# Patient Record
Sex: Female | Born: 1990
Health system: Southern US, Community
[De-identification: ages and names within clinical notes are randomized; demographics above are authoritative.]

## PROBLEM LIST (undated history)

## (undated) ENCOUNTER — Inpatient Hospital Stay (HOSPITAL_COMMUNITY): Payer: Self-pay

## (undated) DIAGNOSIS — D649 Anemia, unspecified: Secondary | ICD-10-CM

## (undated) DIAGNOSIS — R51 Headache: Secondary | ICD-10-CM

## (undated) HISTORY — PX: NO PAST SURGERIES: SHX2092

---
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
## 2012-04-03 NOTE — L&D Delivery Note (Signed)
After reviewing with the patient the risks of a vacuum delivery, verbal informed consent was obtained.  The vacuum was placed at +2 station.  There were no pop-offs and no complications.  Hassen Bruun L. Harraway-Smith, M.D., [facility] Core

## 2012-04-03 NOTE — L&D Delivery Note (Signed)
Operative Delivery Note Pushed for a half hour, with little descent, so labored down for an hour.  Then began pushing again and pushed for 2 hours. At that time, there was molding but no caput. Dr Burnice Logan  Katrinka Blazing was consulted.  She did intensive pushing and the vertex was brought low enough to attempt a vacuum assisted delivery.  At  a viable and healthy female was delivered via .  Presentation: vertex; Position: Left,, Occiput,, Anterior; Station: +1.  Verbal consent: obtained from patient.  Risks and benefits discussed in detail.  Risks include, but are not limited to the risks of anesthesia, bleeding, infection, damage to maternal tissues, fetal cephalhematoma.  There is also the risk of inability to effect vaginal delivery of the head, or shoulder dystocia that cannot be resolved by established maneuvers, leading to the need for emergency cesarean section.  Kiwi vacuum was applied to the vertex. There were 3 pulls with no pop-offs No difficulty with shoulders.  APGAR: 8/9 ; weight .   Placenta status: spontaneous and grossly intact with 3VC .   Cord:  with the following complications: nuchal cord x 1, clamped and cut prior to delivery  Anesthesia: Epidural Instruments: KIWI vacuum Episiotomy: none Lacerations: periurethral, right vaginal sulcus, and 2nd degree perineal Suture Repair: 3.0 vicryl Est. Blood Loss (mL): 400  Mom to postpartum.  Baby to with mother.  [facility] 01/15/2013, 5:12 AM

## 2012-07-31 ENCOUNTER — Encounter (HOSPITAL_COMMUNITY): Payer: Self-pay

## 2012-07-31 ENCOUNTER — Inpatient Hospital Stay (HOSPITAL_COMMUNITY)
Admission: AD | Admit: 2012-07-31 | Discharge: 2012-07-31 | Disposition: A | Discharge status: Home / Self Care | Payer: Medicaid Other | Source: Ambulatory Visit | Attending: Obstetrics & Gynecology | Admitting: Obstetrics & Gynecology

## 2012-07-31 DIAGNOSIS — Z3490 Encounter for supervision of normal pregnancy, unspecified, unspecified trimester: Secondary | ICD-10-CM

## 2012-07-31 DIAGNOSIS — R109 Unspecified abdominal pain: Secondary | ICD-10-CM | POA: Insufficient documentation

## 2012-07-31 DIAGNOSIS — O99891 Other specified diseases and conditions complicating pregnancy: Secondary | ICD-10-CM | POA: Insufficient documentation

## 2012-07-31 DIAGNOSIS — N949 Unspecified condition associated with female genital organs and menstrual cycle: Secondary | ICD-10-CM

## 2012-07-31 HISTORY — DX: Headache: R51

## 2012-07-31 LAB — URINE MICROSCOPIC-ADD ON

## 2012-07-31 LAB — URINALYSIS, ROUTINE W REFLEX MICROSCOPIC
Bilirubin Urine: NEGATIVE
Glucose, UA: NEGATIVE mg/dL
Nitrite: NEGATIVE
Protein, ur: NEGATIVE mg/dL
Urobilinogen, UA: 0.2 mg/dL (ref 0.0–1.0)
pH: 7 (ref 5.0–8.0)

## 2012-07-31 LAB — WET PREP, GENITAL: Yeast Wet Prep HPF POC: NONE SEEN

## 2012-07-31 NOTE — MAU Note (Signed)
Pt states here for no pnc, 1st pregnancy, is worried r/t no care thus far. Denies bleeding or vag d/c changes. Plans to possibly go to [facility] for [facility]. Here for lower abd pain bilaterally when getting up, turning, etc. Pain is intermittent.

## 2012-07-31 NOTE — MAU Provider Note (Signed)
History     CSN: [id number]  Arrival date and time: 07/31/12 1025   First Provider Initiated Contact with Patient 07/31/12 1343      Chief Complaint  Patient presents with  . Abdominal Pain   HPI  Natalie Marks is 22 y.o. G1P0 [redacted]w[redacted]d weeks presenting with abdominal pain-describes as sharps, intermittent pain with quick movements.  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  Denies vaginal bleeding or discharge. Denies nausea, vomiting.   Plans  OB care at [facility].     Past Medical History  Diagnosis Date  . Headache     Past Surgical History  Procedure Laterality Date  . No past surgeries      History reviewed. No pertinent family history.  History  Substance Use Topics  . Smoking status: Never Smoker   . Smokeless tobacco: Never Used  . Alcohol Use: No    Allergies: No Known Allergies  Prescriptions prior to admission  Medication Sig Dispense Refill  . acetaminophen (TYLENOL) 325 MG tablet Take 650 mg by mouth every 6 (six) hours as needed for pain.      . Prenatal Vit-Fe Fumarate-FA (PRENATAL MULTIVITAMIN) TABS Take 1 tablet by mouth daily at 12 noon.        Review of Systems  Constitutional: Negative.   HENT: Negative.   Gastrointestinal: Positive for abdominal pain (sharp, intermittent on the sides, bilaterally). Negative for nausea, vomiting, diarrhea and constipation.  Genitourinary:       Neg for vaginal bleeding and discharge   Physical Exam   Blood pressure 100/56, pulse 86, temperature 98.3 F (36.8 C), temperature source Oral, resp. rate 16, height 5' (1.524 m), weight 108 lb 6.4 oz (49.17 kg), last menstrual period 04/05/2012, SpO2 99.00%.  Physical Exam  Constitutional: She is oriented to person, place, and time. She appears well-developed and well-nourished. No distress.  HENT:  Head: Normocephalic.  Neck: Normal range of motion.  Cardiovascular: Normal rate.   Respiratory: Effort normal.  GI: Soft. She exhibits no distension and no mass.  There is no tenderness. There is no rebound and no guarding.  Patient points to lower sides bilaterally   Genitourinary: There is no rash, tenderness or lesion on the right labia. There is no rash, tenderness or lesion on the left labia. Uterus is enlarged (measures 17 weeks size). Uterus is not tender. No erythema or bleeding around the vagina. Vaginal discharge (small amount of yellowish discharge without odor) found.  Neurological: She is alert and oriented to person, place, and time.  Skin: Skin is warm and dry.  Psychiatric: She has a normal mood and affect. Her behavior is normal.   FETAL HEART RATE  150s  Results for orders placed during the hospital encounter of 07/31/12 (from the past 24 hour(s))  URINALYSIS, ROUTINE W REFLEX MICROSCOPIC     Status: Abnormal   Collection Time    07/31/12 10:49 AM      Result Value Range   Color, Urine YELLOW  YELLOW   APPearance CLEAR  CLEAR   Specific Gravity, Urine 1.015  1.005 - 1.030   pH 7.0  5.0 - 8.0   Glucose, UA NEGATIVE  NEGATIVE mg/dL   Hgb urine dipstick NEGATIVE  NEGATIVE   Bilirubin Urine NEGATIVE  NEGATIVE   Ketones, ur NEGATIVE  NEGATIVE mg/dL   Protein, ur NEGATIVE  NEGATIVE mg/dL   Urobilinogen, UA 0.2  0.0 - 1.0 mg/dL   Nitrite NEGATIVE  NEGATIVE   Leukocytes, UA LARGE (*)  NEGATIVE  URINE MICROSCOPIC-ADD ON     Status: Abnormal   Collection Time    07/31/12 10:49 AM      Result Value Range   Squamous Epithelial / LPF FEW (*) RARE   WBC, UA 7-10  <3 WBC/hpf   Bacteria, UA FEW (*) RARE   Urine-Other MUCOUS PRESENT    WET PREP, GENITAL     Status: Abnormal   Collection Time    07/31/12  2:00 PM      Result Value Range   Yeast Wet Prep HPF POC NONE SEEN  NONE SEEN   Trich, Wet Prep NONE SEEN  NONE SEEN   Clue Cells Wet Prep HPF POC FEW (*) NONE SEEN   WBC, Wet Prep HPF POC MANY (*) NONE SEEN   MAU Course  Procedures  MDM  Assessment and Plan  A:  Viable IUP at [redacted]w[redacted]d gestation       Abdominal pain consistent  with Round ligament pain  P:  Begin prenatal care with [facility].      Reassured, instructed to move a little more slowly to prevent discomfort.   Laketra Bowdish,EVE M 07/31/2012, 1:44 PM

## 2012-07-31 NOTE — MAU Note (Signed)
Patient state she has had no prenatal care. Has been having sharp right side pain on and off with movement for 2-3 weeks. Denies bleeding, leaking, nausea, vomiting or diarrhea.

## 2012-08-01 LAB — URINE CULTURE

## 2012-08-02 ENCOUNTER — Encounter: Payer: Self-pay | Admitting: Obstetrics & Gynecology

## 2012-08-02 ENCOUNTER — Ambulatory Visit (INDEPENDENT_AMBULATORY_CARE_PROVIDER_SITE_OTHER): Payer: Self-pay | Admitting: Obstetrics & Gynecology

## 2012-08-02 VITALS — BP 91/54 | Wt 108.0 lb

## 2012-08-02 DIAGNOSIS — Z3402 Encounter for supervision of normal first pregnancy, second trimester: Secondary | ICD-10-CM

## 2012-08-02 DIAGNOSIS — Z3400 Encounter for supervision of normal first pregnancy, unspecified trimester: Secondary | ICD-10-CM

## 2012-08-02 NOTE — Progress Notes (Signed)
   Subjective:    Natalie Marks is a G1P0 [redacted]w[redacted]d being seen today for her first obstetrical visit.  Her obstetrical history is significant for teenager. Patient does intend to breast feed. Pregnancy history fully reviewed.  Patient reports no complaints.  There were no vitals filed for this visit.  HISTORY: OB History   Grav Para Term Preterm Abortions TAB SAB Ect Mult Living   1              # Outc Date GA Lbr Len/2nd Wgt Sex Del Anes PTL Lv   1 CUR              Past Medical History  Diagnosis Date  . Headache    Past Surgical History  Procedure Laterality Date  . No past surgeries     No family history on file.   Exam    Uterus:     Pelvic Exam:    Perineum: No Hemorrhoids   Vulva: normal   Vagina:  normal mucosa   pH:    Cervix: anteverted   Adnexa: normal adnexa   Bony Pelvis: android  System: Breast:  normal appearance, no masses or tenderness   Skin: normal coloration and turgor, no rashes    Neurologic: oriented   Extremities: normal strength, tone, and muscle mass   HEENT PERRLA   Mouth/Teeth mucous membranes moist, pharynx normal without lesions   Neck supple   Cardiovascular: regular rate and rhythm   Respiratory:  appears well, vitals normal, no respiratory distress, acyanotic, normal RR, ear and throat exam is normal, neck free of mass or lymphadenopathy, chest clear, no wheezing, crepitations, rhonchi, normal symmetric air entry   Abdomen: soft, non-tender; bowel sounds normal; no masses,  no organomegaly   Urinary: urethral meatus normal      Assessment:    Pregnancy: G1P0 There are no active problems to display for this patient.       Plan:     Initial labs drawn. Prenatal vitamins. Problem list reviewed and updated. Genetic Screening discussed Quad Screen: requested.  Ultrasound discussed; fetal survey: requested.  Follow up in 4 weeks.    Natalie Marks C. 08/02/2012

## 2012-08-02 NOTE — Progress Notes (Signed)
P - 81 

## 2012-08-03 LAB — HIV ANTIBODY (ROUTINE TESTING W REFLEX): HIV: NONREACTIVE

## 2012-08-04 LAB — CULTURE, OB URINE
Colony Count: NO GROWTH
Organism ID, Bacteria: NO GROWTH

## 2012-08-05 NOTE — MAU Provider Note (Signed)
Attestation of Attending Supervision of Advanced Practitioner (CNM/NP): Evaluation and management procedures were performed by the Advanced Practitioner under my supervision and collaboration. I have reviewed the Advanced Practitioner's note and chart, and I agree with the management and plan.  Siani Utke H. 3:35 PM

## 2012-08-06 LAB — ALPHA FETOPROTEIN, MATERNAL
AFP: 33.6 IU/mL
Curr Gest Age: 17 wks.days
MoM for AFP: 0.83

## 2012-08-06 LAB — OBSTETRIC PANEL
Antibody Screen: NEGATIVE
Eosinophils Absolute: 0.1 10*3/uL (ref 0.0–0.7)
MCHC: 34 g/dL (ref 30.0–36.0)
Monocytes Relative: 6 % (ref 3–12)
Rubella: 1.71 Index — ABNORMAL HIGH (ref <0.90)

## 2012-08-12 ENCOUNTER — Ambulatory Visit (HOSPITAL_COMMUNITY)
Admission: RE | Admit: 2012-08-12 | Discharge: 2012-08-12 | Disposition: A | Discharge status: Home / Self Care | Payer: Medicaid Other | Source: Ambulatory Visit | Attending: Obstetrics & Gynecology | Admitting: Obstetrics & Gynecology

## 2012-08-12 DIAGNOSIS — Z363 Encounter for antenatal screening for malformations: Secondary | ICD-10-CM | POA: Insufficient documentation

## 2012-08-12 DIAGNOSIS — Z3402 Encounter for supervision of normal first pregnancy, second trimester: Secondary | ICD-10-CM

## 2012-08-12 DIAGNOSIS — O358XX0 Maternal care for other (suspected) fetal abnormality and damage, not applicable or unspecified: Secondary | ICD-10-CM | POA: Insufficient documentation

## 2012-08-12 DIAGNOSIS — Z1389 Encounter for screening for other disorder: Secondary | ICD-10-CM | POA: Insufficient documentation

## 2012-08-30 ENCOUNTER — Encounter: Payer: Self-pay | Admitting: Obstetrics & Gynecology

## 2012-09-03 ENCOUNTER — Encounter: Payer: Self-pay | Admitting: Family Medicine

## 2012-09-03 ENCOUNTER — Ambulatory Visit (INDEPENDENT_AMBULATORY_CARE_PROVIDER_SITE_OTHER): Payer: Medicaid Other | Admitting: Family Medicine

## 2012-09-03 VITALS — BP 99/60 | Wt 119.0 lb

## 2012-09-03 DIAGNOSIS — Z3402 Encounter for supervision of normal first pregnancy, second trimester: Secondary | ICD-10-CM

## 2012-09-03 DIAGNOSIS — Z3400 Encounter for supervision of normal first pregnancy, unspecified trimester: Secondary | ICD-10-CM | POA: Insufficient documentation

## 2012-09-03 NOTE — Patient Instructions (Signed)
Pregnancy - Second Trimester The second trimester of pregnancy (3 to 6 months) is a period of rapid growth for you and your baby. At the end of the sixth month, your baby is about 9 inches long and weighs 1 1/2 pounds. You will begin to feel the baby move between 18 and 20 weeks of the pregnancy. This is called quickening. Weight gain is faster. A clear fluid (colostrum) may leak out of your breasts. You may feel small contractions of the womb (uterus). This is known as false labor or Braxton-Hicks contractions. This is like a practice for labor when the baby is ready to be born. Usually, the problems with morning sickness have usually passed by the end of your first trimester. Some women develop small dark blotches (called cholasma, mask of pregnancy) on their face that usually goes away after the baby is born. Exposure to the sun makes the blotches worse. Acne may also develop in some pregnant women and pregnant women who have acne, may find that it goes away. PRENATAL EXAMS  Blood work may continue to be done during prenatal exams. These tests are done to check on your health and the probable health of your baby. Blood work is used to follow your blood levels (hemoglobin). Anemia (low hemoglobin) is common during pregnancy. Iron and vitamins are given to help prevent this. You will also be checked for diabetes between 24 and 28 weeks of the pregnancy. Some of the previous blood tests may be repeated.  The size of the uterus is measured during each visit. This is to make sure that the baby is continuing to grow properly according to the dates of the pregnancy.  Your blood pressure is checked every prenatal visit. This is to make sure you are not getting toxemia.  Your urine is checked to make sure you do not have an infection, diabetes or protein in the urine.  Your weight is checked often to make sure gains are happening at the suggested rate. This is to ensure that both you and your baby are  growing normally.  Sometimes, an ultrasound is performed to confirm the proper growth and development of the baby. This is a test which bounces harmless sound waves off the baby so your caregiver can more accurately determine due dates. Sometimes, a test is done on the amniotic fluid surrounding the baby. This test is called an amniocentesis. The amniotic fluid is obtained by sticking a needle into the belly (abdomen). This is done to check the chromosomes in instances where there is a concern about possible genetic problems with the baby. It is also sometimes done near the end of pregnancy if an early delivery is required. In this case, it is done to help make sure the baby's lungs are mature enough for the baby to live outside of the womb. CHANGES OCCURING IN THE SECOND TRIMESTER OF PREGNANCY Your body goes through many changes during pregnancy. They vary from person to person. Talk to your caregiver about changes you notice that you are concerned about.  During the second trimester, you will likely have an increase in your appetite. It is normal to have cravings for certain foods. This varies from person to person and pregnancy to pregnancy.  Your lower abdomen will begin to bulge.  You may have to urinate more often because the uterus and baby are pressing on your bladder. It is also common to get more bladder infections during pregnancy. You can help this by drinking lots of fluids   and emptying your bladder before and after intercourse.  You may begin to get stretch marks on your hips, abdomen, and breasts. These are normal changes in the body during pregnancy. There are no exercises or medicines to take that prevent this change.  You may begin to develop swollen and bulging veins (varicose veins) in your legs. Wearing support hose, elevating your feet for 15 minutes, 3 to 4 times a day and limiting salt in your diet helps lessen the problem.  Heartburn may develop as the uterus grows and  pushes up against the stomach. Antacids recommended by your caregiver helps with this problem. Also, eating smaller meals 4 to 5 times a day helps.  Constipation can be treated with a stool softener or adding bulk to your diet. Drinking lots of fluids, and eating vegetables, fruits, and whole grains are helpful.  Exercising is also helpful. If you have been very active up until your pregnancy, most of these activities can be continued during your pregnancy. If you have been less active, it is helpful to start an exercise program such as walking.  Hemorrhoids may develop at the end of the second trimester. Warm sitz baths and hemorrhoid cream recommended by your caregiver helps hemorrhoid problems.  Backaches may develop during this time of your pregnancy. Avoid heavy lifting, wear low heal shoes, and practice good posture to help with backache problems.  Some pregnant women develop tingling and numbness of their hand and fingers because of swelling and tightening of ligaments in the wrist (carpel tunnel syndrome). This goes away after the baby is born.  As your breasts enlarge, you may have to get a bigger bra. Get a comfortable, cotton, support bra. Do not get a nursing bra until the last month of the pregnancy if you will be nursing the baby.  You may get a dark line from your belly button to the pubic area called the linea nigra.  You may develop rosy cheeks because of increase blood flow to the face.  You may develop spider looking lines of the face, neck, arms, and chest. These go away after the baby is born. HOME CARE INSTRUCTIONS   It is extremely important to avoid all smoking, herbs, alcohol, and unprescribed drugs during your pregnancy. These chemicals affect the formation and growth of the baby. Avoid these chemicals throughout the pregnancy to ensure the delivery of a healthy infant.  Most of your home care instructions are the same as suggested for the first trimester of your  pregnancy. Keep your caregiver's appointments. Follow your caregiver's instructions regarding medicine use, exercise, and diet.  During pregnancy, you are providing food for you and your baby. Continue to eat regular, well-balanced meals. Choose foods such as meat, fish, milk and other low fat dairy products, vegetables, fruits, and whole-grain breads and cereals. Your caregiver will tell you of the ideal weight gain.  A physical sexual relationship may be continued up until near the end of pregnancy if there are no other problems. Problems could include early (premature) leaking of amniotic fluid from the membranes, vaginal bleeding, abdominal pain, or other medical or pregnancy problems.  Exercise regularly if there are no restrictions. Check with your caregiver if you are unsure of the safety of some of your exercises. The greatest weight gain will occur in the last 2 trimesters of pregnancy. Exercise will help you:  Control your weight.  Get you in shape for labor and delivery.  Lose weight after you have the baby.  Wear   a good support or jogging bra for breast tenderness during pregnancy. This may help if worn during sleep. Pads or tissues may be used in the bra if you are leaking colostrum.  Do not use hot tubs, steam rooms or saunas throughout the pregnancy.  Wear your seat belt at all times when driving. This protects you and your baby if you are in an accident.  Avoid raw meat, uncooked cheese, cat litter boxes, and soil used by cats. These carry germs that can cause birth defects in the baby.  The second trimester is also a good time to visit your dentist for your dental health if this has not been done yet. Getting your teeth cleaned is okay. Use a soft toothbrush. Brush gently during pregnancy.  It is easier to leak urine during pregnancy. Tightening up and strengthening the pelvic muscles will help with this problem. Practice stopping your urination while you are going to the  bathroom. These are the same muscles you need to strengthen. It is also the muscles you would use as if you were trying to stop from passing gas. You can practice tightening these muscles up 10 times a set and repeating this about 3 times per day. Once you know what muscles to tighten up, do not perform these exercises during urination. It is more likely to contribute to an infection by backing up the urine.  Ask for help if you have financial, counseling, or nutritional needs during pregnancy. Your caregiver will be able to offer counseling for these needs as well as refer you for other special needs.  Your skin may become oily. If so, wash your face with mild soap, use non-greasy moisturizer and oil or cream based makeup. MEDICINES AND DRUG USE IN PREGNANCY  Take prenatal vitamins as directed. The vitamin should contain 1 milligram of folic acid. Keep all vitamins out of reach of children. Only a couple vitamins or tablets containing iron may be fatal to a baby or young child when ingested.  Avoid use of all medicines, including herbs, over-the-counter medicines, not prescribed or suggested by your caregiver. Only take over-the-counter or prescription medicines for pain, discomfort, or fever as directed by your caregiver. Do not use aspirin.  Let your caregiver also know about herbs you may be using.  Alcohol is related to a number of birth defects. This includes fetal alcohol syndrome. All alcohol, in any form, should be avoided completely. Smoking will cause low birth rate and premature babies.  Street or illegal drugs are very harmful to the baby. They are absolutely forbidden. A baby born to an addicted mother will be addicted at birth. The baby will go through the same withdrawal an adult does. SEEK MEDICAL CARE IF:  You have any concerns or worries during your pregnancy. It is better to call with your questions if you feel they cannot wait, rather than worry about them. SEEK IMMEDIATE  MEDICAL CARE IF:   An unexplained oral temperature above 102 F (38.9 C) develops, or as your caregiver suggests.  You have leaking of fluid from the vagina (birth canal). If leaking membranes are suspected, take your temperature and tell your caregiver of this when you call.  There is vaginal spotting, bleeding, or passing clots. Tell your caregiver of the amount and how many pads are used. Light spotting in pregnancy is common, especially following intercourse.  You develop a bad smelling vaginal discharge with a change in the color from clear to white.  You continue to feel   sick to your stomach (nauseated) and have no relief from remedies suggested. You vomit blood or coffee ground-like materials.  You lose more than 2 pounds of weight or gain more than 2 pounds of weight over 1 week, or as suggested by your caregiver.  You notice swelling of your face, hands, feet, or legs.  You get exposed to German measles and have never had them.  You are exposed to fifth disease or chickenpox.  You develop belly (abdominal) pain. Round ligament discomfort is a common non-cancerous (benign) cause of abdominal pain in pregnancy. Your caregiver still must evaluate you.  You develop a bad headache that does not go away.  You develop fever, diarrhea, pain with urination, or shortness of breath.  You develop visual problems, blurry, or double vision.  You fall or are in a car accident or any kind of trauma.  There is mental or physical violence at home. Document Released: 03/14/2001 Document Revised: 12/13/2011 Document Reviewed: 09/16/2008 [employer] Patient Information 2014 [employer].  Breastfeeding A change in hormones during your pregnancy causes growth of your breast tissue and an increase in number and size of milk ducts. The hormone prolactin allows proteins, sugars, and fats from your blood supply to make breast milk in your milk-producing glands. The hormone progesterone prevents  breast milk from being released before the birth of your baby. After the birth of your baby, your progesterone level decreases allowing breast milk to be released. Thoughts of your baby, as well as his or her sucking or crying, can stimulate the release of milk from the milk-producing glands. Deciding to breastfeed (nurse) is one of the best choices you can make for you and your baby. The information that follows gives a brief review of the benefits, as well as other important skills to know about breastfeeding. BENEFITS OF BREASTFEEDING For your baby  The first milk (colostrum) helps your baby's digestive system function better.   There are antibodies in your milk that help your baby fight off infections.   Your baby has a lower incidence of asthma, allergies, and sudden infant death syndrome (SIDS).   The nutrients in breast milk are better for your baby than infant formulas.  Breast milk improves your baby's brain development.   Your baby will have less gas, colic, and constipation.  Your baby is less likely to develop other conditions, such as childhood obesity, asthma, or diabetes mellitus. For you  Breastfeeding helps develop a very special bond between you and your baby.   Breastfeeding is convenient, always available at the correct temperature, and costs nothing.   Breastfeeding helps to burn calories and helps you lose the weight gained during pregnancy.   Breastfeeding makes your uterus contract back down to normal size faster and slows bleeding following delivery.   Breastfeeding mothers have a lower risk of developing osteoporosis or breast or ovarian cancer later in life.  BREASTFEEDING FREQUENCY  A healthy, full-term baby may breastfeed as often as every hour or space his or her feedings to every 3 hours. Breastfeeding frequency will vary from baby to baby.   Newborns should be fed no less than every 2 3 hours during the day and every 4 5 hours during the  night. You should breastfeed a minimum of 8 feedings in a 24 hour period.  Awaken your baby to breastfeed if it has been 3 4 hours since the last feeding.  Breastfeed when you feel the need to reduce the fullness of your breasts or when   your newborn shows signs of hunger. Signs that your baby may be hungry include:  Increased alertness or activity.  Stretching.  Movement of the head from side to side.  Movement of the head and opening of the mouth when the corner of the mouth or cheek is stroked (rooting).  Increased sucking sounds, smacking lips, cooing, sighing, or squeaking.  Hand-to-mouth movements.  Increased sucking of fingers or hands.  Fussing.  Intermittent crying.  Signs of extreme hunger will require calming and consoling before you try to feed your baby. Signs of extreme hunger may include:  Restlessness.  A loud, strong cry.  Screaming.  Frequent feeding will help you make more milk and will help prevent problems, such as sore nipples and engorgement of the breasts.  BREASTFEEDING   Whether lying down or sitting, be sure that the baby's abdomen is facing your abdomen.   Support your breast with 4 fingers under your breast and your thumb above your nipple. Make sure your fingers are well away from your nipple and your baby's mouth.   Stroke your baby's lips gently with your finger or nipple.   When your baby's mouth is open wide enough, place all of your nipple and as much of the colored area around your nipple (areola) as possible into your baby's mouth.  More areola should be visible above his or her upper lip than below his or her lower lip.  Your baby's tongue should be between his or her lower gum and your breast.  Ensure that your baby's mouth is correctly positioned around the nipple (latched). Your baby's lips should create a seal on your breast.  Signs that your baby has effectively latched onto your nipple include:  Tugging or sucking  without pain.  Swallowing heard between sucks.  Absent click or smacking sound.  Muscle movement above and in front of his or her ears with sucking.  Your baby must suck about 2 3 minutes in order to get your milk. Allow your baby to feed on each breast as long as he or she wants. Nurse your baby until he or she unlatches or falls asleep at the first breast, then offer the second breast.  Signs that your baby is full and satisfied include:  A gradual decrease in the number of sucks or complete cessation of sucking.  Falling asleep.  Extension or relaxation of his or her body.  Retention of a small amount of milk in his or her mouth.  Letting go of your breast by himself or herself.  Signs of effective breastfeeding in you include:  Breasts that have increased firmness, weight, and size prior to feeding.  Breasts that are softer after nursing.  Increased milk volume, as well as a change in milk consistency and color by the 5th day of breastfeeding.  Breast fullness relieved by breastfeeding.  Nipples are not sore, cracked, or bleeding.  If needed, break the suction by putting your finger into the corner of your baby's mouth and sliding your finger between his or her gums. Then, remove your breast from his or her mouth.  It is common for babies to spit up a small amount after a feeding.  Babies often swallow air during feeding. This can make babies fussy. Burping your baby between breasts can help with this.  Vitamin D supplements are recommended for babies who get only breast milk.  Avoid using a pacifier during your baby's first 4 6 weeks.  Avoid supplemental feedings of water, formula, or   juice in place of breastfeeding. Breast milk is all the food your baby needs. It is not necessary for your baby to have water or formula. Your breasts will make more milk if supplemental feedings are avoided during the early weeks. HOW TO TELL WHETHER YOUR BABY IS GETTING ENOUGH BREAST  MILK Wondering whether or not your baby is getting enough milk is a common concern among mothers. You can be assured that your baby is getting enough milk if:   Your baby is actively sucking and you hear swallowing.   Your baby seems relaxed and satisfied after a feeding.   Your baby nurses at least 8 12 times in a 24 hour time period.  During the first 3 5 days of age:  Your baby is wetting at least 3 5 diapers in a 24 hour period. The urine should be clear and pale yellow.  Your baby is having at least 3 4 stools in a 24 hour period. The stool should be soft and yellow.  At 5 7 days of age, your baby is having at least 3 6 stools in a 24 hour period. The stool should be seedy and yellow by 5 days of age.  Your baby has a weight loss less than 7 10% during the first 3 days of age.  Your baby does not lose weight after 3 7 days of age.  Your baby gains 4 7 ounces each week after he or she is 4 days of age.  Your baby gains weight by 5 days of age and is back to birth weight within 2 weeks. ENGORGEMENT In the first week after your baby is born, you may experience extremely full breasts (engorgement). When engorged, your breasts may feel heavy, warm, or tender to the touch. Engorgement peaks within 24 48 hours after delivery of your baby.  Engorgement may be reduced by:  Continuing to breastfeed.  Increasing the frequency of breastfeeding.  Taking warm showers or applying warm, moist heat to your breasts just before each feeding. This increases circulation and helps the milk flow.   Gently massaging your breast before and during the feedings. With your fingertips, massage from your chest wall towards your nipple in a circular motion.   Ensuring that your baby empties at least one breast at every feeding. It also helps to start the next feeding on the opposite breast.   Expressing breast milk by hand or by using a breast pump to empty the breasts if your baby is sleepy, or  not nursing well. You may also want to express milk if you are returning to work oryou feel you are getting engorged.  Ensuring your baby is latched on and positioned properly while breastfeeding. If you follow these suggestions, your engorgement should improve in 24 48 hours. If you are still experiencing difficulty, call your lactation consultant or caregiver.  CARING FOR YOURSELF Take care of your breasts.  Bathe or shower daily.   Avoid using soap on your nipples.   Wear a supportive bra. Avoid wearing underwire style bras.  Air dry your nipples for a 3 4minutes after each feeding.   Use only cotton bra pads to absorb breast milk leakage. Leaking of breast milk between feedings is normal.   Use only pure lanolin on your nipples after nursing. You do not need to wash it off before feeding your baby again. Another option is to express a few drops of breast milk and gently massage that milk into your nipples.  Continue   breast self-awareness checks. Take care of yourself.  Eat healthy foods. Alternate 3 meals with 3 snacks.  Avoid foods that you notice affect your baby in a bad way.  Drink milk, fruit juice, and water to satisfy your thirst (about 8 glasses a day).   Rest often, relax, and take your prenatal vitamins to prevent fatigue, stress, and anemia.  Avoid chewing and smoking tobacco.  Avoid alcohol and drug use.  Take over-the-counter and prescribed medicine only as directed by your caregiver or pharmacist. You should always check with your caregiver or pharmacist before taking any new medicine, vitamin, or herbal supplement.  Know that pregnancy is possible while breastfeeding. If desired, talk to your caregiver about family planning and safe birth control methods that may be used while breastfeeding. SEEK MEDICAL CARE IF:   You feel like you want to stop breastfeeding or have become frustrated with breastfeeding.  You have painful breasts or nipples.  Your  nipples are cracked or bleeding.  Your breasts are red, tender, or warm.  You have a swollen area on either breast.  You have a fever or chills.  You have nausea or vomiting.  You have drainage from your nipples.  Your breasts do not become full before feedings by the 5th day after delivery.  You feel sad and depressed.  Your baby is too sleepy to eat well.  Your baby is having trouble sleeping.   Your baby is wetting less than 3 diapers in a 24 hour period.  Your baby has less than 3 stools in a 24 hour period.  Your baby's skin or the white part of his or her eyes becomes more yellow.   Your baby is not gaining weight by 5 days of age. MAKE SURE YOU:   Understand these instructions.  Will watch your condition.  Will get help right away if you are not doing well or get worse. Document Released: 03/20/2005 Document Revised: 12/13/2011 Document Reviewed: 10/25/2011 [employer] Patient Information 2014 [employer].  

## 2012-09-03 NOTE — Progress Notes (Signed)
Feeling good movement.

## 2012-09-26 ENCOUNTER — Encounter (HOSPITAL_COMMUNITY): Payer: Self-pay

## 2012-09-26 ENCOUNTER — Inpatient Hospital Stay (HOSPITAL_COMMUNITY)
Admission: AD | Admit: 2012-09-26 | Discharge: 2012-09-26 | Disposition: A | Discharge status: Home / Self Care | Payer: Medicaid Other | Source: Ambulatory Visit | Attending: Obstetrics & Gynecology | Admitting: Obstetrics & Gynecology

## 2012-09-26 DIAGNOSIS — R109 Unspecified abdominal pain: Secondary | ICD-10-CM | POA: Insufficient documentation

## 2012-09-26 DIAGNOSIS — R42 Dizziness and giddiness: Secondary | ICD-10-CM

## 2012-09-26 DIAGNOSIS — O99891 Other specified diseases and conditions complicating pregnancy: Secondary | ICD-10-CM | POA: Insufficient documentation

## 2012-09-26 DIAGNOSIS — Z3402 Encounter for supervision of normal first pregnancy, second trimester: Secondary | ICD-10-CM

## 2012-09-26 LAB — URINALYSIS, ROUTINE W REFLEX MICROSCOPIC
Bilirubin Urine: NEGATIVE
Glucose, UA: NEGATIVE mg/dL
Ketones, ur: NEGATIVE mg/dL

## 2012-09-26 LAB — GLUCOSE, CAPILLARY: Glucose-Capillary: 101 mg/dL — ABNORMAL HIGH (ref 70–99)

## 2012-09-26 LAB — CBC
HCT: 29.8 % — ABNORMAL LOW (ref 36.0–46.0)
MCHC: 33.9 g/dL (ref 30.0–36.0)
RDW: 13.9 % (ref 11.5–15.5)
WBC: 9.7 10*3/uL (ref 4.0–10.5)

## 2012-09-26 MED ORDER — MECLIZINE HCL 25 MG PO TABS
25.0000 mg | ORAL_TABLET | Freq: Once | ORAL | Status: AC
  Administered 2012-09-26: 25 mg via ORAL
  Filled 2012-09-26: qty 1

## 2012-09-26 MED ORDER — LACTATED RINGERS IV BOLUS (SEPSIS)
500.0000 mL | Freq: Once | INTRAVENOUS | Status: AC
  Administered 2012-09-26: 500 mL via INTRAVENOUS

## 2012-09-26 MED ORDER — MECLIZINE HCL 25 MG PO TABS: 25.0000 mg | ORAL_TABLET | Freq: Three times a day (TID) | ORAL | Status: DC | PRN

## 2012-09-26 NOTE — MAU Note (Signed)
Pt states last pm noted dizziness, denies n/v/d. Went to bed early and woke up this am feeling same way. Feeling shaky, dizzy. Denies abnormal vaginal d/c changes or bleeding.

## 2012-09-26 NOTE — MAU Note (Signed)
Notes intermittent cramping felt moreso on right side of lower abdomen. Rates 5/10, however no pain at present

## 2012-09-26 NOTE — MAU Provider Note (Signed)
  History     CSN: [id number]  Arrival date and time: 09/26/12 1002   First Provider Initiated Contact with Patient 09/26/12 1146      Chief Complaint  Patient presents with  . Dizziness   HPI Natalie Marks is a 46 YOF G1P0 [redacted]w[redacted]d complaining of dizziness beginning last night and R sided intermittent abdominal cramping beginning this morning. Reports feeling shaky and dizzy constantly while sitting and standing; describes blacking out. Denies syncopal episodes. Cramping began this AM on the R sides; pain comes and goes, rated 5/10, no pain currently.  Hx of round ligament pain this pregnancy, reports this feels different.  Endorses SOB on exertion beginnig today. Fatigue x 1 week. Denies palpitations.  Denies contractions. + fetal mvmt.  Denies vaginal bleeding/discharge, n/v/d, HA.    Past Medical History  Diagnosis Date  . [id number](478.2)     Past Surgical History  Procedure Laterality Date  . No past surgeries      History reviewed. No pertinent family history.  History  Substance Use Topics  . Smoking status: Never Smoker   . Smokeless tobacco: Never Used  . Alcohol Use: No    Allergies: No Known Allergies  Prescriptions prior to admission  Medication Sig Dispense Refill  . Prenatal Vit-Min-FA-Fish Oil ([facility] PRENATAL GUMMY PO) Take 2 each by mouth daily at 12 noon.        ROS Physical Exam   Blood pressure 110/65, pulse 92, temperature 99.7 F (37.6 C), temperature source Oral, resp. rate 18, height 5\' 2"  (1.575 m), last menstrual period 04/05/2012, SpO2 100.00%.  Physical Exam  Constitutional: She is oriented to person, place, and time. She appears well-developed and well-nourished.  Cardiovascular: Normal rate, regular rhythm and normal heart sounds.   No murmur heard. Respiratory: Effort normal and breath sounds normal. No respiratory distress.  GI: Soft. She exhibits no distension. There is no tenderness. There is no rebound and no guarding.   Musculoskeletal: She exhibits no edema and no tenderness.  Neurological: She is alert and oriented to person, place, and time.  Skin: Skin is warm and dry. No pallor.   FHR: baseline 140, mod variability, 15x15 accels, no decels Toco: Irritability noted.   MAU Course  Procedures  MDM  Orthostatic blood pressures Blood glucose: 101  Assessment and Plan   Ax: [redacted]w[redacted]d, G1P0   Plan: Meclizine, IV fluids  Natalie Marks   I have seen the patient and agree with the above note. Patient now improved following Meclizine and IV fluids.   Will discharge home with Meclizine.  Natalie Other DO Family Medicine PGY-1  I saw and examined patient along with student and agree with above note.   Natalie Marks 09/27/2012 6:10 PM

## 2012-10-02 ENCOUNTER — Ambulatory Visit (INDEPENDENT_AMBULATORY_CARE_PROVIDER_SITE_OTHER): Payer: Medicaid Other | Admitting: Obstetrics & Gynecology

## 2012-10-02 VITALS — BP 111/68 | Wt 131.0 lb

## 2012-10-02 DIAGNOSIS — Z3400 Encounter for supervision of normal first pregnancy, unspecified trimester: Secondary | ICD-10-CM

## 2012-10-02 DIAGNOSIS — Z3402 Encounter for supervision of normal first pregnancy, second trimester: Secondary | ICD-10-CM

## 2012-10-02 NOTE — Progress Notes (Signed)
P = 91 

## 2012-10-02 NOTE — Patient Instructions (Addendum)
Tetanus, Diphtheria (Td) or Tetanus, Diphtheria, Pertussis (Tdap) Vaccine What You Need to Know WHY GET VACCINATED? Tetanus, diphtheria and pertussis can be very serious diseases. TETANUS (Lockjaw) causes painful muscle spasms and stiffness, usually all over the body.  Tetanus can lead to tightening of muscles in the head and neck so the victim cannot open his mouth or swallow, or sometimes even breathe. Tetanus kills about 1 out of 5 people who are infected. DIPHTHERIA can cause a thick membrane to cover the back of the throat.  Diphtheria can lead to breathing problems, paralysis, heart failure, and even death. PERTUSSIS (Whooping Cough) causes severe coughing spells which can lead to difficulty breathing, vomiting, and disturbed sleep.  Pertussis can lead to weight loss, incontinence, rib fractures and passing out from violent coughing. Up to 2 in 100 adolescents and 5 in 100 adults with pertussis are hospitalized or have complications, including pneumonia and death. These 3 diseases are all caused by bacteria. Diphtheria and pertussis are spread from person to person. Tetanus enters the body through cuts, scratches, or wounds. The [place] saw as many as 200,000 cases a year of diphtheria and pertussis before vaccines were available, and hundreds of cases of tetanus. Since then, tetanus and diphtheria cases have dropped by about 99% and pertussis cases by about 92%. Children 6 years of age and younger get DTaP vaccine to protect them from these three diseases. But older children, adolescents, and adults need protection too. VACCINES FOR ADOLESCENTS AND ADULTS: TD AND TDAP Two vaccines are available to protect people 7 years of age and older from these diseases:  Td vaccine has been used for many years. It protects against tetanus and diphtheria.  Tdap vaccine was licensed in 2005. It is the first vaccine for adolescents and adults that protects against pertussis as well as tetanus and  diphtheria. A Td booster dose is recommended every 10 years. Tdap is given only once. WHICH VACCINE, AND WHEN? Ages 7 through 18 years  A dose of Tdap is recommended at age 11 or 12. This dose could be given as early as age 7 for children who missed one or more childhood doses of DTaP.  Children and adolescents who did not get a complete series of DTaP shots by age 7 should complete the series using a combination of Td and Tdap. Age 19 years and Older  All adults should get a booster dose of Td every 10 years. Adults under 65 who have never gotten Tdap should get a dose of Tdap as their next booster dose. Adults 65 and older may get one booster dose of Tdap.  Adults (including women who may become pregnant and adults 65 and older) who expect to have close contact with a baby younger than 12 months of age should get a dose of Tdap to help protect the baby from pertussis.  Healthcare professionals who have direct patient contact in hospitals or clinics should get one dose of Tdap. Protection After a Wound  A person who gets a severe cut or burn might need a dose of Td or Tdap to prevent tetanus infection. Tdap should be used for anyone who has never had a dose previously. Td should be used if Tdap is not available, or for:  Anybody who has already had a dose of Tdap.  Children 7 through 9 years of age who completed the childhood DTaP series.  Adults 65 and older. Pregnant Women  Pregnant women who have never had a dose of Tdap   should get one, after the 20th week of gestation and preferably during the 3rd trimester. If they do not get Tdap during their pregnancy they should get a dose as soon as possible after delivery. Pregnant women who have previously received Tdap and need tetanus or diphtheria vaccine while pregnant should get Td. Tdap and Td may be given at the same time as other vaccines. SOME PEOPLE SHOULD NOT BE VACCINATED OR SHOULD WAIT  Anyone who has had a life-threatening  allergic reaction after a dose of any tetanus, diphtheria, or pertussis containing vaccine should not get Td or Tdap.  Anyone who has a severe allergy to any component of a vaccine should not get that vaccine. Tell your doctor if the person getting the vaccine has any severe allergies.  Anyone who had a coma, or long or multiple seizures within 7 days after a dose of DTP or DTaP should not get Tdap, unless a cause other than the vaccine was found. These people may get Td.  Talk to your doctor if the person getting either vaccine:  Has epilepsy or another nervous system problem.  Had severe swelling or severe pain after a previous dose of DTP, DTaP, DT, Td, or Tdap vaccine.  Has had Guillain Barr Syndrome (GBS). Anyone who has a moderate or severe illness on the day the shot is scheduled should usually wait until they recover before getting Tdap or Td vaccine. A person with a mild illness or low fever can usually be vaccinated. WHAT ARE THE RISKS FROM TDAP AND TD VACCINES? With a vaccine, as with any medicine, there is always a small risk of a life-threatening allergic reaction or other serious problem. Brief fainting spells and related symptoms (such as jerking movements) can happen after any medical procedure, including vaccination. Sitting or lying down for about 15 minutes after a vaccination can help prevent fainting and injuries caused by falls. Tell your doctor if the patient feels dizzy or lightheaded, or has vision changes or ringing in the ears. Getting tetanus, diphtheria, or pertussis would be much more likely to lead to severe problems than getting either Td or Tdap vaccine. Problems reported after Td and Tdap vaccines are listed below. Mild Problems (noticeable, but did not interfere with activities) Tdap  Pain (about 3 in 4 adolescents and 2 in 3 adults).  Redness or swelling (about 1 in 5).  Mild fever of at least 100.4 F (38 C) (up to about 1 in 25 adolescents and 1 in  100 adults).  Headache (about 4 in 10 adolescents and 3 in 10 adults).  Tiredness (about 1 in 3 adolescents and 1 in 4 adults).  Nausea, vomiting, diarrhea, or stomach ache (up to 1 in 4 adolescents and 1 in 10 adults).  Chills, body aches, sore joints, rash, or swollen glands (uncommon). Td  Pain (up to about 8 in 10).  Redness or swelling at the injection site (up to about 1 in 3).  Mild fever (up to about 1 in 15).  Headache or tiredness (uncommon). Moderate Problems (interfered with activities, but did not require medical attention) Tdap  Pain at the injection site (about 1 in 20 adolescents and 1 in 100 adults).  Redness or swelling at the injection site (up to about 1 in 16 adolescents and 1 in 25 adults).  Fever over 102 F (38.9 C) (about 1 in 100 adolescents and 1 in 250 adults).  Headache (1 in 300).  Nausea, vomiting, diarrhea, or stomach ache (up to 3   in 100 adolescents and 1 in 100 adults). Td  Fever over 102 F (38.9 C) (rare). Tdap or Td  Extensive swelling of the arm where the shot was given (up to about 3 in 100). Severe Problems (Unable to perform usual activities; required medical attention) Tdap or Td  Swelling, severe pain, bleeding, and redness in the arm where the shot was given (rare). A severe allergic reaction could occur after any vaccine. They are estimated to occur less than once in a million doses. WHAT IF THERE IS A SEVERE REACTION? What should I look for? Any unusual condition, such as a severe allergic reaction or a high fever. If a severe allergic reaction occurred, it would be within a few minutes to an hour after the shot. Signs of a serious allergic reaction can include difficulty breathing, weakness, hoarseness or wheezing, a fast heartbeat, hives, dizziness, paleness, or swelling of the throat. What should I do?  Call a doctor, or get the person to a doctor right away.  Tell your doctor what happened, the date and time it  happened, and when the vaccination was given.  Ask your provider to report the reaction by filing a Vaccine Adverse Event Reporting System (VAERS) form. Or, you can file this report through the VAERS website at www.vaers.LAgents.no or by calling 1-651-709-5177. VAERS does not provide medical advice. THE NATIONAL VACCINE INJURY COMPENSATION PROGRAM The National Vaccine Injury Compensation Program (VICP) was created in 1986. Persons who believe they may have been injured by a vaccine can learn about the program and about filing a claim by calling 1-(224)693-0694 or visiting the VICP website at SpiritualWord.at. HOW CAN I LEARN MORE?  Your doctor can give you the vaccine package insert or suggest other sources of information.  Call your local or state health department.  Contact the Centers for Disease Control and Prevention (CDC):  Call 848-637-0358 (1-800-CDC-INFO).  Visit the [facility] website at PicCapture.uy. CDC Td and Tdap Interim VIS (04/26/10) Document Released: 01/15/2006 Document Revised: 06/12/2011 Document Reviewed: 04/26/2010 [employer] Patient Information 2014 [place], [place].   Contraception Choices Contraception (birth control) is the use of any methods or devices to prevent pregnancy. Below are some methods to help avoid pregnancy. HORMONAL METHODS   Contraceptive implant. This is a thin, plastic tube containing progesterone hormone. It does not contain estrogen hormone. Your caregiver inserts the tube in the inner part of the upper arm. The tube can remain in place for up to 3 years. After 3 years, the implant must be removed. The implant prevents the ovaries from releasing an egg (ovulation), thickens the cervical mucus which prevents sperm from entering the uterus, and thins the lining of the inside of the uterus.  Progesterone-only injections. These injections are given every 3 months by your caregiver to prevent pregnancy. This synthetic progesterone  hormone stops the ovaries from releasing eggs. It also thickens cervical mucus and changes the uterine lining. This makes it harder for sperm to survive in the uterus.  Birth control pills. These pills contain estrogen and progesterone hormone. They work by stopping the egg from forming in the ovary (ovulation). Birth control pills are prescribed by a caregiver.Birth control pills can also be used to treat heavy periods.  Minipill. This type of birth control pill contains only the progesterone hormone. They are taken every day of each month and must be prescribed by your caregiver.  Birth control patch. The patch contains hormones similar to those in birth control pills. It must be changed once a  week and is prescribed by a caregiver.  Vaginal ring. The ring contains hormones similar to those in birth control pills. It is left in the vagina for 3 weeks, removed for 1 week, and then a new one is put back in place. The patient must be comfortable inserting and removing the ring from the vagina.A caregiver's prescription is necessary.  Emergency contraception. Emergency contraceptives prevent pregnancy after unprotected sexual intercourse. This pill can be taken right after sex or up to 5 days after unprotected sex. It is most effective the sooner you take the pills after having sexual intercourse. Emergency contraceptive pills are available without a prescription. Check with your pharmacist. Do not use emergency contraception as your only form of birth control. BARRIER METHODS   Female condom. This is a thin sheath (latex or rubber) that is worn over the penis during sexual intercourse. It can be used with spermicide to increase effectiveness.  Female condom. This is a soft, loose-fitting sheath that is put into the vagina before sexual intercourse.  Diaphragm. This is a soft, latex, dome-shaped barrier that must be fitted by a caregiver. It is inserted into the vagina, along with a spermicidal jelly.  It is inserted before intercourse. The diaphragm should be left in the vagina for 6 to 8 hours after intercourse.  Cervical cap. This is a round, soft, latex or plastic cup that fits over the cervix and must be fitted by a caregiver. The cap can be left in place for up to 48 hours after intercourse.  Sponge. This is a soft, circular piece of polyurethane foam. The sponge has spermicide in it. It is inserted into the vagina after wetting it and before sexual intercourse.  Spermicides. These are chemicals that kill or block sperm from entering the cervix and uterus. They come in the form of creams, jellies, suppositories, foam, or tablets. They do not require a prescription. They are inserted into the vagina with an applicator before having sexual intercourse. The process must be repeated every time you have sexual intercourse. INTRAUTERINE CONTRACEPTION  Intrauterine device (IUD). This is a T-shaped device that is put in a woman's uterus during a menstrual period to prevent pregnancy. There are 2 types:  Copper IUD. This type of IUD is wrapped in copper wire and is placed inside the uterus. Copper makes the uterus and fallopian tubes produce a fluid that kills sperm. It can stay in place for 10 years.  Hormone IUD. This type of IUD contains the hormone progestin (synthetic progesterone). The hormone thickens the cervical mucus and prevents sperm from entering the uterus, and it also thins the uterine lining to prevent implantation of a fertilized egg. The hormone can weaken or kill the sperm that get into the uterus. It can stay in place for 5 years. PERMANENT METHODS OF CONTRACEPTION  Female tubal ligation. This is when the woman's fallopian tubes are surgically sealed, tied, or blocked to prevent the egg from traveling to the uterus.  Female sterilization. This is when the female has the tubes that carry sperm tied off (vasectomy).This blocks sperm from entering the vagina during sexual intercourse.  After the procedure, the man can still ejaculate fluid (semen). NATURAL PLANNING METHODS  Natural family planning. This is not having sexual intercourse or using a barrier method (condom, diaphragm, cervical cap) on days the woman could become pregnant.  Calendar method. This is keeping track of the length of each menstrual cycle and identifying when you are fertile.  Ovulation method. This is  avoiding sexual intercourse during ovulation.  Symptothermal method. This is avoiding sexual intercourse during ovulation, using a thermometer and ovulation symptoms.  Post-ovulation method. This is timing sexual intercourse after you have ovulated. Regardless of which type or method of contraception you choose, it is important that you use condoms to protect against the transmission of sexually transmitted diseases (STDs). Talk with your caregiver about which form of contraception is most appropriate for you. Document Released: 03/20/2005 Document Revised: 06/12/2011 Document Reviewed: 07/27/2010 [facility] Patient Information 2014 [place], [place].

## 2012-10-02 NOTE — Progress Notes (Signed)
Third trimester labs next visit, counseled about Tdap vaccine.  No other complaints or concerns.  Fetal movement and labor precautions reviewed.

## 2012-10-23 ENCOUNTER — Encounter: Payer: Self-pay | Admitting: Obstetrics and Gynecology

## 2012-10-23 ENCOUNTER — Ambulatory Visit (INDEPENDENT_AMBULATORY_CARE_PROVIDER_SITE_OTHER): Payer: Medicaid Other | Admitting: Obstetrics and Gynecology

## 2012-10-23 ENCOUNTER — Encounter: Payer: Medicaid Other | Admitting: Obstetrics and Gynecology

## 2012-10-23 VITALS — BP 113/71 | Wt 140.0 lb

## 2012-10-23 DIAGNOSIS — Z3403 Encounter for supervision of normal first pregnancy, third trimester: Secondary | ICD-10-CM

## 2012-10-23 DIAGNOSIS — Z3400 Encounter for supervision of normal first pregnancy, unspecified trimester: Secondary | ICD-10-CM

## 2012-10-23 DIAGNOSIS — Z23 Encounter for immunization: Secondary | ICD-10-CM

## 2012-10-23 LAB — CBC
Hemoglobin: 9.9 g/dL — ABNORMAL LOW (ref 12.0–15.0)
MCH: 30.7 pg (ref 26.0–34.0)
MCV: 92.3 fL (ref 78.0–100.0)
Platelets: 193 10*3/uL (ref 150–400)
RBC: 3.23 MIL/uL — ABNORMAL LOW (ref 3.87–5.11)
RDW: 13.1 % (ref 11.5–15.5)
WBC: 8.1 10*3/uL (ref 4.0–10.5)

## 2012-10-23 MED ORDER — TETANUS-DIPHTH-ACELL PERTUSSIS 5-2.5-18.5 LF-MCG/0.5 IM SUSP: 0.5000 mL | Freq: Once | INTRAMUSCULAR | Status: DC

## 2012-10-23 NOTE — Addendum Note (Signed)
Addended by: Tandy Gaw C on: 10/23/2012 10:28 AM   Modules accepted: Orders

## 2012-10-23 NOTE — Progress Notes (Signed)
Patient doing well without any complaints. FM/PTL precautions reviewed. 1 hr GCT and labs today. Tdap offered today.

## 2012-10-23 NOTE — Progress Notes (Signed)
P-85 

## 2012-10-24 ENCOUNTER — Encounter: Payer: Self-pay | Admitting: Obstetrics and Gynecology

## 2012-10-24 LAB — HIV ANTIBODY (ROUTINE TESTING W REFLEX): HIV: NONREACTIVE

## 2012-11-06 ENCOUNTER — Encounter: Payer: Self-pay | Admitting: Obstetrics and Gynecology

## 2012-11-06 ENCOUNTER — Ambulatory Visit (INDEPENDENT_AMBULATORY_CARE_PROVIDER_SITE_OTHER): Payer: Medicaid Other | Admitting: Obstetrics and Gynecology

## 2012-11-06 VITALS — BP 114/64 | Wt 140.0 lb

## 2012-11-06 DIAGNOSIS — Z3403 Encounter for supervision of normal first pregnancy, third trimester: Secondary | ICD-10-CM

## 2012-11-06 DIAGNOSIS — Z3400 Encounter for supervision of normal first pregnancy, unspecified trimester: Secondary | ICD-10-CM

## 2012-11-06 NOTE — Progress Notes (Signed)
Patient is doing well without complaints. FM/PTL precautions reviewed.  

## 2012-11-20 ENCOUNTER — Encounter: Payer: Self-pay | Admitting: Obstetrics and Gynecology

## 2012-11-20 ENCOUNTER — Ambulatory Visit (INDEPENDENT_AMBULATORY_CARE_PROVIDER_SITE_OTHER): Payer: Medicaid Other | Admitting: Obstetrics and Gynecology

## 2012-11-20 VITALS — BP 109/61 | Wt 147.0 lb

## 2012-11-20 DIAGNOSIS — Z3403 Encounter for supervision of normal first pregnancy, third trimester: Secondary | ICD-10-CM

## 2012-11-20 DIAGNOSIS — Z3400 Encounter for supervision of normal first pregnancy, unspecified trimester: Secondary | ICD-10-CM

## 2012-11-20 NOTE — Progress Notes (Signed)
P-82 Patient reports decreased fetal movement for 2-3 days.  Still feeling movement just not nearly as often.

## 2012-11-20 NOTE — Progress Notes (Signed)
Patient doing well without complaints. FM/PTL precautions reviewed. Patient planning on using Paraguard IUD for contraception

## 2012-11-22 ENCOUNTER — Encounter (HOSPITAL_COMMUNITY): Payer: Self-pay

## 2012-11-22 ENCOUNTER — Inpatient Hospital Stay (HOSPITAL_COMMUNITY)
Admission: AD | Admit: 2012-11-22 | Discharge: 2012-11-22 | Disposition: A | Discharge status: Home / Self Care | Payer: Medicaid Other | Source: Ambulatory Visit | Attending: Obstetrics & Gynecology | Admitting: Obstetrics & Gynecology

## 2012-11-22 DIAGNOSIS — A499 Bacterial infection, unspecified: Secondary | ICD-10-CM | POA: Insufficient documentation

## 2012-11-22 DIAGNOSIS — O479 False labor, unspecified: Secondary | ICD-10-CM

## 2012-11-22 DIAGNOSIS — O4703 False labor before 37 completed weeks of gestation, third trimester: Secondary | ICD-10-CM

## 2012-11-22 DIAGNOSIS — N760 Acute vaginitis: Secondary | ICD-10-CM | POA: Insufficient documentation

## 2012-11-22 DIAGNOSIS — O2390 Unspecified genitourinary tract infection in pregnancy, unspecified trimester: Secondary | ICD-10-CM | POA: Insufficient documentation

## 2012-11-22 DIAGNOSIS — O4700 False labor before 37 completed weeks of gestation, unspecified trimester: Secondary | ICD-10-CM | POA: Insufficient documentation

## 2012-11-22 DIAGNOSIS — B9689 Other specified bacterial agents as the cause of diseases classified elsewhere: Secondary | ICD-10-CM | POA: Insufficient documentation

## 2012-11-22 LAB — WET PREP, GENITAL
Trich, Wet Prep: NONE SEEN
Yeast Wet Prep HPF POC: NONE SEEN

## 2012-11-22 LAB — URINALYSIS, ROUTINE W REFLEX MICROSCOPIC
Bilirubin Urine: NEGATIVE
Ketones, ur: NEGATIVE mg/dL
Nitrite: NEGATIVE
Protein, ur: NEGATIVE mg/dL
Urobilinogen, UA: 0.2 mg/dL (ref 0.0–1.0)
pH: 6.5 (ref 5.0–8.0)

## 2012-11-22 MED ORDER — METRONIDAZOLE 500 MG PO TABS: 500.0000 mg | ORAL_TABLET | Freq: Two times a day (BID) | ORAL | Status: DC

## 2012-11-22 NOTE — MAU Provider Note (Signed)
Chief Complaint:  Contractions   First Provider Initiated Contact with Patient 11/22/12 [phone]     HPI: Natalie Marks is a 22 y.o. G1P0000 at [redacted]w[redacted]d who presents to maternity admissions reporting contractions every 5 minutes since 0300. Reports increased odorless white discharge. Denies leakage of fluid or vaginal bleeding. Good fetal movement.   Pregnancy Course: Uncomplicated.  Past Medical History: Past Medical History  Diagnosis Date  . Headache(784.0)     Past obstetric history: OB History  Gravida Para Term Preterm AB SAB TAB Ectopic Multiple Living  1 0 0 0 0 0 0 0 0 0     # Outcome Date GA Lbr Len/2nd Weight Sex Delivery Anes PTL Lv  1 CUR               Past Surgical History: Past Surgical History  Procedure Laterality Date  . No past surgeries       Family History: History reviewed. No pertinent family history.  Social History: History  Substance Use Topics  . Smoking status: Never Smoker   . Smokeless tobacco: Never Used  . Alcohol Use: No    Allergies: No Known Allergies  Meds:  Prescriptions prior to admission  Medication Sig Dispense Refill  . meclizine (ANTIVERT) 25 MG tablet Take 1 tablet (25 mg total) by mouth 3 (three) times daily as needed for dizziness.  30 tablet  0  . Prenatal Vit-Min-FA-Fish Oil ([facility] PRENATAL GUMMY PO) Take 2 each by mouth daily at 12 noon.        ROS: Pertinent findings in history of present illness.  Physical Exam  Blood pressure 118/55, pulse 98, temperature 98.2 F (36.8 C), temperature source Oral, resp. rate 18, height 5\' 2"  (1.575 m), weight 66.225 kg (146 lb), last menstrual period 04/05/2012, SpO2 100.00%. GENERAL: Well-developed, well-nourished female in no acute distress.  HEENT: normocephalic HEART: normal rate RESP: normal effort ABDOMEN: Soft, non-tender, gravid appropriate for gestational age EXTREMITIES: Nontender, no edema NEURO: alert and oriented SPECULUM EXAM: NEFG, moderate amount of creamy, white,  mildly malodorous discharge, no blood, cervix clean Dilation: Closed Effacement (%): Thick Cervical Position: Posterior Station: Ballotable Presentation: Undeterminable Exam by:: Dorathy Kinsman, CNM  FHT:  Baseline 130 , moderate variability, accelerations present, no decelerations Contractions: UI   Labs: Results for orders placed during the hospital encounter of 11/22/12 (from the past 24 hour(s))  URINALYSIS, ROUTINE W REFLEX MICROSCOPIC     Status: Abnormal   Collection Time    11/22/12  5:29 AM      Result Value Range   Color, Urine YELLOW  YELLOW   APPearance HAZY (*) CLEAR   Specific Gravity, Urine 1.020  1.005 - 1.030   pH 6.5  5.0 - 8.0   Glucose, UA NEGATIVE  NEGATIVE mg/dL   Hgb urine dipstick NEGATIVE  NEGATIVE   Bilirubin Urine NEGATIVE  NEGATIVE   Ketones, ur NEGATIVE  NEGATIVE mg/dL   Protein, ur NEGATIVE  NEGATIVE mg/dL   Urobilinogen, UA 0.2  0.0 - 1.0 mg/dL   Nitrite NEGATIVE  NEGATIVE   Leukocytes, UA LARGE (*) NEGATIVE  URINE MICROSCOPIC-ADD ON     Status: Abnormal   Collection Time    11/22/12  5:29 AM      Result Value Range   Squamous Epithelial / LPF RARE  RARE   WBC, UA 11-20  <3 WBC/hpf   Bacteria, UA FEW (*) RARE  WET PREP, GENITAL     Status: Abnormal   Collection Time    11/22/12  6:10 AM      Result Value Range   Yeast Wet Prep HPF POC NONE SEEN  NONE SEEN   Trich, Wet Prep NONE SEEN  NONE SEEN   Clue Cells Wet Prep HPF POC MODERATE (*) NONE SEEN   WBC, Wet Prep HPF POC MODERATE (*) NONE SEEN   Imaging:  No results found.  MAU Course: Pt denies any contractions while in MAU.   Assessment: 1. Preterm contractions, third trimester   2. BV (bacterial vaginosis)    Plan: Discharge home in stable condition.  Preterm labor precautions and fetal kick counts. Increase fluids and rest.      Follow-up Information   Follow up with [facility] On 12/03/2012. (or as needed if symptoms worsen)    Specialty:   Obstetrics and Gynecology   Contact information:   [address] [place] [address] [phone]      Follow up with [facility] [place] [facility]. (As needed if symptoms worsen)    Contact information:   [address] [id number] [place] [address] [phone]       Medication List         CVS PRENATAL GUMMY PO  Take 2 each by mouth daily at 12 noon.     meclizine 25 MG tablet  Commonly known as:  ANTIVERT  Take 1 tablet (25 mg total) by mouth 3 (three) times daily as needed for dizziness.     metroNIDAZOLE 500 MG tablet  Commonly known as:  FLAGYL  Take 1 tablet (500 mg total) by mouth 2 (two) times daily.       [place], [place] 11/22/2012 6:48 AM

## 2012-11-22 NOTE — MAU Note (Signed)
Pt reports contractions since last pm, worsening since about 3 am. Denies bleeding

## 2012-11-23 LAB — URINE CULTURE: Culture: NO GROWTH

## 2012-11-25 ENCOUNTER — Ambulatory Visit (INDEPENDENT_AMBULATORY_CARE_PROVIDER_SITE_OTHER): Payer: Medicaid Other | Admitting: Obstetrics and Gynecology

## 2012-11-25 ENCOUNTER — Encounter: Payer: Self-pay | Admitting: Obstetrics and Gynecology

## 2012-11-25 VITALS — BP 106/67 | Wt 148.0 lb

## 2012-11-25 DIAGNOSIS — Z3403 Encounter for supervision of normal first pregnancy, third trimester: Secondary | ICD-10-CM

## 2012-11-25 DIAGNOSIS — Z3400 Encounter for supervision of normal first pregnancy, unspecified trimester: Secondary | ICD-10-CM

## 2012-11-25 LAB — COMPREHENSIVE METABOLIC PANEL
ALT: 11 U/L (ref 0–35)
AST: 16 U/L (ref 0–37)
BUN: 9 mg/dL (ref 6–23)
Calcium: 8.2 mg/dL — ABNORMAL LOW (ref 8.4–10.5)
Chloride: 107 mEq/L (ref 96–112)
Creat: 0.42 mg/dL — ABNORMAL LOW (ref 0.50–1.10)
Total Bilirubin: 0.3 mg/dL (ref 0.3–1.2)

## 2012-11-25 LAB — CBC
HCT: 27.5 % — ABNORMAL LOW (ref 36.0–46.0)
MCH: 29.8 pg (ref 26.0–34.0)
MCV: 88.1 fL (ref 78.0–100.0)
Platelets: 185 10*3/uL (ref 150–400)
RDW: 12.9 % (ref 11.5–15.5)

## 2012-11-25 NOTE — MAU Provider Note (Signed)
Attestation of Attending Supervision of Advanced Practitioner (CNM/NP): Evaluation and management procedures were performed by the Advanced Practitioner under my supervision and collaboration.  I have reviewed the Advanced Practitioner's note and chart, and I agree with the management and plan.  HARRAWAY-SMITH, Deago Burruss 10:29 PM     

## 2012-11-25 NOTE — Progress Notes (Signed)
P-82.   Pt has had a headache since last night.  The headache is getting worse.  Pt reports blurry vision with black spots that come and go.    Pt has not taken anything for the headache.

## 2012-11-25 NOTE — Progress Notes (Signed)
Patient reports persistent headache since last night which is worsening in intensity. She has not taken tylenol as she did not know if it was ok to take in conjunction with Flagyl. Will collect PIH labs as outpatient in view of normal BP and negative urine protein. Advise patient to present to [facility]U if headache persists despite tylenol. FM/PTL/preeclampsia precautions reviewed.

## 2012-11-27 LAB — PROTEIN, URINE, 24 HOUR: Protein, Urine: 6 mg/dL

## 2012-12-04 ENCOUNTER — Ambulatory Visit (INDEPENDENT_AMBULATORY_CARE_PROVIDER_SITE_OTHER): Payer: Medicaid Other | Admitting: Obstetrics & Gynecology

## 2012-12-04 VITALS — BP 108/74 | Wt 147.0 lb

## 2012-12-04 DIAGNOSIS — Z3403 Encounter for supervision of normal first pregnancy, third trimester: Secondary | ICD-10-CM

## 2012-12-04 DIAGNOSIS — Z3400 Encounter for supervision of normal first pregnancy, unspecified trimester: Secondary | ICD-10-CM

## 2012-12-04 NOTE — Progress Notes (Signed)
Resolved headaches.  No other complaints or concerns.  Fetal movement and labor precautions reviewed. Pelvic cultures next visit.

## 2012-12-04 NOTE — Progress Notes (Signed)
P-85 

## 2012-12-04 NOTE — Patient Instructions (Signed)
Return to clinic for any obstetric concerns or go to [facility]U for evaluation  

## 2012-12-18 ENCOUNTER — Encounter: Payer: Self-pay | Admitting: Obstetrics and Gynecology

## 2012-12-18 ENCOUNTER — Ambulatory Visit (INDEPENDENT_AMBULATORY_CARE_PROVIDER_SITE_OTHER): Payer: Medicaid Other | Admitting: Obstetrics and Gynecology

## 2012-12-18 VITALS — BP 105/65 | Wt 152.0 lb

## 2012-12-18 DIAGNOSIS — Z3400 Encounter for supervision of normal first pregnancy, unspecified trimester: Secondary | ICD-10-CM

## 2012-12-18 DIAGNOSIS — Z3403 Encounter for supervision of normal first pregnancy, third trimester: Secondary | ICD-10-CM

## 2012-12-18 LAB — OB RESULTS CONSOLE GC/CHLAMYDIA
Chlamydia: NEGATIVE
Gonorrhea: NEGATIVE

## 2012-12-18 NOTE — Addendum Note (Signed)
Addended by: Barbara Cower on: 12/18/2012 01:31 PM   Modules accepted: Orders

## 2012-12-18 NOTE — Progress Notes (Signed)
Patient doing well without complaints. FM/labor precautions reviewed. Cultures collected

## 2012-12-18 NOTE — Progress Notes (Signed)
P-83 

## 2012-12-19 LAB — GC/CHLAMYDIA PROBE AMP
CT Probe RNA: NEGATIVE
GC Probe RNA: NEGATIVE

## 2012-12-21 LAB — OB RESULTS CONSOLE GBS: GBS: NEGATIVE

## 2012-12-21 LAB — CULTURE, BETA STREP (GROUP B ONLY)

## 2012-12-25 ENCOUNTER — Encounter: Payer: Self-pay | Admitting: Obstetrics and Gynecology

## 2012-12-25 ENCOUNTER — Ambulatory Visit (INDEPENDENT_AMBULATORY_CARE_PROVIDER_SITE_OTHER): Payer: Medicaid Other | Admitting: Obstetrics and Gynecology

## 2012-12-25 VITALS — BP 120/72 | Wt 155.0 lb

## 2012-12-25 DIAGNOSIS — Z3403 Encounter for supervision of normal first pregnancy, third trimester: Secondary | ICD-10-CM

## 2012-12-25 DIAGNOSIS — Z3400 Encounter for supervision of normal first pregnancy, unspecified trimester: Secondary | ICD-10-CM

## 2012-12-25 NOTE — Progress Notes (Signed)
Patient doing well without complaints. FM/labor precautions reviewed. Cervival exam unchanged

## 2012-12-25 NOTE — Progress Notes (Signed)
P=79 

## 2013-01-01 ENCOUNTER — Ambulatory Visit (INDEPENDENT_AMBULATORY_CARE_PROVIDER_SITE_OTHER): Payer: Medicaid Other | Admitting: Obstetrics and Gynecology

## 2013-01-01 ENCOUNTER — Encounter: Payer: Self-pay | Admitting: Obstetrics and Gynecology

## 2013-01-01 VITALS — BP 117/78 | Wt 154.0 lb

## 2013-01-01 DIAGNOSIS — Z3403 Encounter for supervision of normal first pregnancy, third trimester: Secondary | ICD-10-CM

## 2013-01-01 DIAGNOSIS — Z3400 Encounter for supervision of normal first pregnancy, unspecified trimester: Secondary | ICD-10-CM

## 2013-01-01 NOTE — Progress Notes (Signed)
Patient doing well without complaints. FM/labor precautions reviewed 

## 2013-01-01 NOTE — Progress Notes (Signed)
P= 80 

## 2013-01-02 ENCOUNTER — Inpatient Hospital Stay (HOSPITAL_COMMUNITY)
Admission: AD | Admit: 2013-01-02 | Discharge: 2013-01-02 | Disposition: A | Discharge status: Home / Self Care | Payer: Medicaid Other | Source: Ambulatory Visit | Attending: Obstetrics & Gynecology | Admitting: Obstetrics & Gynecology

## 2013-01-02 ENCOUNTER — Encounter (HOSPITAL_COMMUNITY): Payer: Self-pay | Admitting: *Deleted

## 2013-01-02 DIAGNOSIS — Z3403 Encounter for supervision of normal first pregnancy, third trimester: Secondary | ICD-10-CM

## 2013-01-02 DIAGNOSIS — O479 False labor, unspecified: Secondary | ICD-10-CM | POA: Insufficient documentation

## 2013-01-02 LAB — URINALYSIS, ROUTINE W REFLEX MICROSCOPIC
Bilirubin Urine: NEGATIVE
Nitrite: NEGATIVE
Protein, ur: NEGATIVE mg/dL
Specific Gravity, Urine: 1.01 (ref 1.005–1.030)
Urobilinogen, UA: 0.2 mg/dL (ref 0.0–1.0)

## 2013-01-02 LAB — URINE MICROSCOPIC-ADD ON

## 2013-01-02 NOTE — MAU Note (Signed)
C/o ucs since 0530;

## 2013-01-03 LAB — URINE CULTURE: Colony Count: 9000

## 2013-01-08 ENCOUNTER — Encounter (HOSPITAL_COMMUNITY): Payer: Self-pay

## 2013-01-08 ENCOUNTER — Ambulatory Visit (INDEPENDENT_AMBULATORY_CARE_PROVIDER_SITE_OTHER): Payer: Medicaid Other | Admitting: Family Medicine

## 2013-01-08 ENCOUNTER — Inpatient Hospital Stay (HOSPITAL_COMMUNITY)
Admission: AD | Admit: 2013-01-08 | Discharge: 2013-01-09 | Disposition: A | Discharge status: Home / Self Care | Payer: Medicaid Other | Source: Ambulatory Visit | Attending: Obstetrics and Gynecology | Admitting: Obstetrics and Gynecology

## 2013-01-08 VITALS — BP 125/79 | Wt 156.0 lb

## 2013-01-08 DIAGNOSIS — Z3400 Encounter for supervision of normal first pregnancy, unspecified trimester: Secondary | ICD-10-CM

## 2013-01-08 DIAGNOSIS — O479 False labor, unspecified: Secondary | ICD-10-CM | POA: Insufficient documentation

## 2013-01-08 DIAGNOSIS — Z3403 Encounter for supervision of normal first pregnancy, third trimester: Secondary | ICD-10-CM

## 2013-01-08 NOTE — Patient Instructions (Addendum)
Pregnancy - Third Trimester The third trimester of pregnancy (the last 3 months) is a period of the most rapid growth for you and your baby. The baby approaches a length of 20 inches and a weight of 6 to 10 pounds. The baby is adding on fat and getting ready for life outside your body. While inside, babies have periods of sleeping and waking, sucking thumbs, and hiccuping. You can often feel small contractions of the uterus. This is false labor. It is also called Braxton-Hicks contractions. This is like a practice for labor. The usual problems in this stage of pregnancy include more difficulty breathing, swelling of the hands and feet from water retention, and having to urinate more often because of the uterus and baby pressing on your bladder.  PRENATAL EXAMS  Blood work may continue to be done during prenatal exams. These tests are done to check on your health and the probable health of your baby. Blood work is used to follow your blood levels (hemoglobin). Anemia (low hemoglobin) is common during pregnancy. Iron and vitamins are given to help prevent this. You may also continue to be checked for diabetes. Some of the past blood tests may be done again.  The size of the uterus is measured during each visit. This makes sure your baby is growing properly according to your pregnancy dates.  Your blood pressure is checked every prenatal visit. This is to make sure you are not getting toxemia.  Your urine is checked every prenatal visit for infection, diabetes, and protein.  Your weight is checked at each visit. This is done to make sure gains are happening at the suggested rate and that you and your baby are growing normally.  Sometimes, an ultrasound is performed to confirm the position and the proper growth and development of the baby. This is a test done that bounces harmless sound waves off the baby so your caregiver can more accurately determine a due date.  Discuss the type of pain medicine and  anesthesia you will have during your labor and delivery.  Discuss the possibility and anesthesia if a cesarean section might be necessary.  Inform your caregiver if there is any mental or physical violence at home. Sometimes, a specialized non-stress test, contraction stress test, and biophysical profile are done to make sure the baby is not having a problem. Checking the amniotic fluid surrounding the baby is called an amniocentesis. The amniotic fluid is removed by sticking a needle into the belly (abdomen). This is sometimes done near the end of pregnancy if an early delivery is required. In this case, it is done to help make sure the baby's lungs are mature enough for the baby to live outside of the womb. If the lungs are not mature and it is unsafe to deliver the baby, an injection of cortisone medicine is given to the mother 1 to 2 days before the delivery. This helps the baby's lungs mature and makes it safer to deliver the baby. CHANGES OCCURING IN THE THIRD TRIMESTER OF PREGNANCY Your body goes through many changes during pregnancy. They vary from person to person. Talk to your caregiver about changes you notice and are concerned about.  During the last trimester, you have probably had an increase in your appetite. It is normal to have cravings for certain foods. This varies from person to person and pregnancy to pregnancy.  You may begin to get stretch marks on your hips, abdomen, and breasts. These are normal changes in the body   during pregnancy. There are no exercises or medicines to take which prevent this change.  Constipation may be treated with a stool softener or adding bulk to your diet. Drinking lots of fluids, fiber in vegetables, fruits, and whole grains are helpful.  Exercising is also helpful. If you have been very active up until your pregnancy, most of these activities can be continued during your pregnancy. If you have been less active, it is helpful to start an exercise  program such as walking. Consult your caregiver before starting exercise programs.  Avoid all smoking, alcohol, non-prescribed drugs, herbs and "street drugs" during your pregnancy. These chemicals affect the formation and growth of the baby. Avoid chemicals throughout the pregnancy to ensure the delivery of a healthy infant.  Backache, varicose veins, and hemorrhoids may develop or get worse.  You will tire more easily in the third trimester, which is normal.  The baby's movements may be stronger and more often.  You may become short of breath easily.  Your belly button may stick out.  A yellow discharge may leak from your breasts called colostrum.  You may have a bloody mucus discharge. This usually occurs a few days to a week before labor begins. HOME CARE INSTRUCTIONS   Keep your caregiver's appointments. Follow your caregiver's instructions regarding medicine use, exercise, and diet.  During pregnancy, you are providing food for you and your baby. Continue to eat regular, well-balanced meals. Choose foods such as meat, fish, milk and other low fat dairy products, vegetables, fruits, and whole-grain breads and cereals. Your caregiver will tell you of the ideal weight gain.  A physical sexual relationship may be continued throughout pregnancy if there are no other problems such as early (premature) leaking of amniotic fluid from the membranes, vaginal bleeding, or belly (abdominal) pain.  Exercise regularly if there are no restrictions. Check with your caregiver if you are unsure of the safety of your exercises. Greater weight gain will occur in the last 2 trimesters of pregnancy. Exercising helps:  Control your weight.  Get you in shape for labor and delivery.  You lose weight after you deliver.  Rest a lot with legs elevated, or as needed for leg cramps or low back pain.  Wear a good support or jogging bra for breast tenderness during pregnancy. This may help if worn during  sleep. Pads or tissues may be used in the bra if you are leaking colostrum.  Do not use hot tubs, steam rooms, or saunas.  Wear your seat belt when driving. This protects you and your baby if you are in an accident.  Avoid raw meat, cat litter boxes and soil used by cats. These carry germs that can cause birth defects in the baby.  It is easier to leak urine during pregnancy. Tightening up and strengthening the pelvic muscles will help with this problem. You can practice stopping your urination while you are going to the bathroom. These are the same muscles you need to strengthen. It is also the muscles you would use if you were trying to stop from passing gas. You can practice tightening these muscles up 10 times a set and repeating this about 3 times per day. Once you know what muscles to tighten up, do not perform these exercises during urination. It is more likely to cause an infection by backing up the urine.  Ask for help if you have financial, counseling, or nutritional needs during pregnancy. Your caregiver will be able to offer counseling for these   needs as well as refer you for other special needs.  Make a list of emergency phone numbers and have them available.  Plan on getting help from family or friends when you go home from the hospital.  Make a trial run to the hospital.  Take prenatal classes with the father to understand, practice, and ask questions about the labor and delivery.  Prepare the baby's room or nursery.  Do not travel out of the city unless it is absolutely necessary and with the advice of your caregiver.  Wear only low or no heal shoes to have better balance and prevent falling. MEDICINES AND DRUG USE IN PREGNANCY  Take prenatal vitamins as directed. The vitamin should contain 1 milligram of folic acid. Keep all vitamins out of reach of children. Only a couple vitamins or tablets containing iron may be fatal to a baby or young child when ingested.  Avoid use  of all medicines, including herbs, over-the-counter medicines, not prescribed or suggested by your caregiver. Only take over-the-counter or prescription medicines for pain, discomfort, or fever as directed by your caregiver. Do not use aspirin, ibuprofen or naproxen unless approved by your caregiver.  Let your caregiver also know about herbs you may be using.  Alcohol is related to a number of birth defects. This includes fetal alcohol syndrome. All alcohol, in any form, should be avoided completely. Smoking will cause low birth rate and premature babies.  Illegal drugs are very harmful to the baby. They are absolutely forbidden. A baby born to an addicted mother will be addicted at birth. The baby will go through the same withdrawal an adult does. SEEK MEDICAL CARE IF: You have any concerns or worries during your pregnancy. It is better to call with your questions if you feel they cannot wait, rather than worry about them. SEEK IMMEDIATE MEDICAL CARE IF:   An unexplained oral temperature above 102 F (38.9 C) develops, or as your caregiver suggests.  You have leaking of fluid from the vagina. If leaking membranes are suspected, take your temperature and tell your caregiver of this when you call.  There is vaginal spotting, bleeding or passing clots. Tell your caregiver of the amount and how many pads are used.  You develop a bad smelling vaginal discharge with a change in the color from clear to white.  You develop vomiting that lasts more than 24 hours.  You develop chills or fever.  You develop shortness of breath.  You develop burning on urination.  You loose more than 2 pounds of weight or gain more than 2 pounds of weight or as suggested by your caregiver.  You notice sudden swelling of your face, hands, and feet or legs.  You develop belly (abdominal) pain. Round ligament discomfort is a common non-cancerous (benign) cause of abdominal pain in pregnancy. Your caregiver still  must evaluate you.  You develop a severe headache that does not go away.  You develop visual problems, blurred or double vision.  If you have not felt your baby move for more than 1 hour. If you think the baby is not moving as much as usual, eat something with sugar in it and lie down on your left side for an hour. The baby should move at least 4 to 5 times per hour. Call right away if your baby moves less than that.  You fall, are in a car accident, or any kind of trauma.  There is mental or physical violence at home. Document Released: 03/14/2001   Document Revised: 12/13/2011 Document Reviewed: 09/16/2008 [employer] Patient Information 2014 [employer].  Breastfeeding A change in hormones during your pregnancy causes growth of your breast tissue and an increase in number and size of milk ducts. The hormone prolactin allows proteins, sugars, and fats from your blood supply to make breast milk in your milk-producing glands. The hormone progesterone prevents breast milk from being released before the birth of your baby. After the birth of your baby, your progesterone level decreases allowing breast milk to be released. Thoughts of your baby, as well as his or her sucking or crying, can stimulate the release of milk from the milk-producing glands. Deciding to breastfeed (nurse) is one of the best choices you can make for you and your baby. The information that follows gives a brief review of the benefits, as well as other important skills to know about breastfeeding. BENEFITS OF BREASTFEEDING For your baby  The first milk (colostrum) helps your baby's digestive system function better.   There are antibodies in your milk that help your baby fight off infections.   Your baby has a lower incidence of asthma, allergies, and sudden infant death syndrome (SIDS).   The nutrients in breast milk are better for your baby than infant formulas.  Breast milk improves your baby's brain development.    Your baby will have less gas, colic, and constipation.  Your baby is less likely to develop other conditions, such as childhood obesity, asthma, or diabetes mellitus. For you  Breastfeeding helps develop a very special bond between you and your baby.   Breastfeeding is convenient, always available at the correct temperature, and costs nothing.   Breastfeeding helps to burn calories and helps you lose the weight gained during pregnancy.   Breastfeeding makes your uterus contract back down to normal size faster and slows bleeding following delivery.   Breastfeeding mothers have a lower risk of developing osteoporosis or breast or ovarian cancer later in life.  BREASTFEEDING FREQUENCY  A healthy, full-term baby may breastfeed as often as every hour or space his or her feedings to every 3 hours. Breastfeeding frequency will vary from baby to baby.   Newborns should be fed no less than every 2 3 hours during the day and every 4 5 hours during the night. You should breastfeed a minimum of 8 feedings in a 24 hour period.  Awaken your baby to breastfeed if it has been 3 4 hours since the last feeding.  Breastfeed when you feel the need to reduce the fullness of your breasts or when your newborn shows signs of hunger. Signs that your baby may be hungry include:  Increased alertness or activity.  Stretching.  Movement of the head from side to side.  Movement of the head and opening of the mouth when the corner of the mouth or cheek is stroked (rooting).  Increased sucking sounds, smacking lips, cooing, sighing, or squeaking.  Hand-to-mouth movements.  Increased sucking of fingers or hands.  Fussing.  Intermittent crying.  Signs of extreme hunger will require calming and consoling before you try to feed your baby. Signs of extreme hunger may include:  Restlessness.  A loud, strong cry.  Screaming.  Frequent feeding will help you make more milk and will help prevent  problems, such as sore nipples and engorgement of the breasts.  BREASTFEEDING   Whether lying down or sitting, be sure that the baby's abdomen is facing your abdomen.   Support your breast with 4 fingers under your breast   and your thumb above your nipple. Make sure your fingers are well away from your nipple and your baby's mouth.   Stroke your baby's lips gently with your finger or nipple.   When your baby's mouth is open wide enough, place all of your nipple and as much of the colored area around your nipple (areola) as possible into your baby's mouth.  More areola should be visible above his or her upper lip than below his or her lower lip.  Your baby's tongue should be between his or her lower gum and your breast.  Ensure that your baby's mouth is correctly positioned around the nipple (latched). Your baby's lips should create a seal on your breast.  Signs that your baby has effectively latched onto your nipple include:  Tugging or sucking without pain.  Swallowing heard between sucks.  Absent click or smacking sound.  Muscle movement above and in front of his or her ears with sucking.  Your baby must suck about 2 3 minutes in order to get your milk. Allow your baby to feed on each breast as long as he or she wants. Nurse your baby until he or she unlatches or falls asleep at the first breast, then offer the second breast.  Signs that your baby is full and satisfied include:  A gradual decrease in the number of sucks or complete cessation of sucking.  Falling asleep.  Extension or relaxation of his or her body.  Retention of a small amount of milk in his or her mouth.  Letting go of your breast by himself or herself.  Signs of effective breastfeeding in you include:  Breasts that have increased firmness, weight, and size prior to feeding.  Breasts that are softer after nursing.  Increased milk volume, as well as a change in milk consistency and color by the 5th  day of breastfeeding.  Breast fullness relieved by breastfeeding.  Nipples are not sore, cracked, or bleeding.  If needed, break the suction by putting your finger into the corner of your baby's mouth and sliding your finger between his or her gums. Then, remove your breast from his or her mouth.  It is common for babies to spit up a small amount after a feeding.  Babies often swallow air during feeding. This can make babies fussy. Burping your baby between breasts can help with this.  Vitamin D supplements are recommended for babies who get only breast milk.  Avoid using a pacifier during your baby's first 4 6 weeks.  Avoid supplemental feedings of water, formula, or juice in place of breastfeeding. Breast milk is all the food your baby needs. It is not necessary for your baby to have water or formula. Your breasts will make more milk if supplemental feedings are avoided during the early weeks. HOW TO TELL WHETHER YOUR BABY IS GETTING ENOUGH BREAST MILK Wondering whether or not your baby is getting enough milk is a common concern among mothers. You can be assured that your baby is getting enough milk if:   Your baby is actively sucking and you hear swallowing.   Your baby seems relaxed and satisfied after a feeding.   Your baby nurses at least 8 12 times in a 24 hour time period.  During the first 3 5 days of age:  Your baby is wetting at least 3 5 diapers in a 24 hour period. The urine should be clear and pale yellow.  Your baby is having at least 3 4 stools in   a 24 hour period. The stool should be soft and yellow.  At 5 7 days of age, your baby is having at least 3 6 stools in a 24 hour period. The stool should be seedy and yellow by 5 days of age.  Your baby has a weight loss less than 7 10% during the first 3 days of age.  Your baby does not lose weight after 3 7 days of age.  Your baby gains 4 7 ounces each week after he or she is 4 days of age.  Your baby gains weight  by 5 days of age and is back to birth weight within 2 weeks. ENGORGEMENT In the first week after your baby is born, you may experience extremely full breasts (engorgement). When engorged, your breasts may feel heavy, warm, or tender to the touch. Engorgement peaks within 24 48 hours after delivery of your baby.  Engorgement may be reduced by:  Continuing to breastfeed.  Increasing the frequency of breastfeeding.  Taking warm showers or applying warm, moist heat to your breasts just before each feeding. This increases circulation and helps the milk flow.   Gently massaging your breast before and during the feedings. With your fingertips, massage from your chest wall towards your nipple in a circular motion.   Ensuring that your baby empties at least one breast at every feeding. It also helps to start the next feeding on the opposite breast.   Expressing breast milk by hand or by using a breast pump to empty the breasts if your baby is sleepy, or not nursing well. You may also want to express milk if you are returning to work oryou feel you are getting engorged.  Ensuring your baby is latched on and positioned properly while breastfeeding. If you follow these suggestions, your engorgement should improve in 24 48 hours. If you are still experiencing difficulty, call your lactation consultant or caregiver.  CARING FOR YOURSELF Take care of your breasts.  Bathe or shower daily.   Avoid using soap on your nipples.   Wear a supportive bra. Avoid wearing underwire style bras.  Air dry your nipples for a 3 4minutes after each feeding.   Use only cotton bra pads to absorb breast milk leakage. Leaking of breast milk between feedings is normal.   Use only pure lanolin on your nipples after nursing. You do not need to wash it off before feeding your baby again. Another option is to express a few drops of breast milk and gently massage that milk into your nipples.  Continue breast  self-awareness checks. Take care of yourself.  Eat healthy foods. Alternate 3 meals with 3 snacks.  Avoid foods that you notice affect your baby in a bad way.  Drink milk, fruit juice, and water to satisfy your thirst (about 8 glasses a day).   Rest often, relax, and take your prenatal vitamins to prevent fatigue, stress, and anemia.  Avoid chewing and smoking tobacco.  Avoid alcohol and drug use.  Take over-the-counter and prescribed medicine only as directed by your caregiver or pharmacist. You should always check with your caregiver or pharmacist before taking any new medicine, vitamin, or herbal supplement.  Know that pregnancy is possible while breastfeeding. If desired, talk to your caregiver about family planning and safe birth control methods that may be used while breastfeeding. SEEK MEDICAL CARE IF:   You feel like you want to stop breastfeeding or have become frustrated with breastfeeding.  You have painful breasts or nipples.    Your nipples are cracked or bleeding.  Your breasts are red, tender, or warm.  You have a swollen area on either breast.  You have a fever or chills.  You have nausea or vomiting.  You have drainage from your nipples.  Your breasts do not become full before feedings by the 5th day after delivery.  You feel sad and depressed.  Your baby is too sleepy to eat well.  Your baby is having trouble sleeping.   Your baby is wetting less than 3 diapers in a 24 hour period.  Your baby has less than 3 stools in a 24 hour period.  Your baby's skin or the white part of his or her eyes becomes more yellow.   Your baby is not gaining weight by 5 days of age. MAKE SURE YOU:   Understand these instructions.  Will watch your condition.  Will get help right away if you are not doing well or get worse. Document Released: 03/20/2005 Document Revised: 12/13/2011 Document Reviewed: 10/25/2011 [employer] Patient Information 2014 [employer]. Vaginal Delivery Your caregiver must first be sure you are in labor. Signs of labor include:  You may pass what is called "the mucus plug" before labor begins. This is a small amount of blood stained mucus.  Regular uterine contractions.  The time between contractions get closer together.  The discomfort and pain gradually gets more intense.  Pains are mostly located in the back.  Pains get worse when walking.  The cervix (the opening of the uterus) becomes thinner (begins to efface) and opens up (dilates). Once you are in labor and admitted into the hospital or care center, your caregiver will do the following:  A complete physical examination.  Check your vital signs (blood pressure, pulse, temperature and the fetal heart rate).  Do a vaginal examination (using a sterile glove and lubricant) to determine:  The position (presentation) of the baby (head [vertex] or buttock first).  The level (station) of the baby's head in the birth canal.  The effacement and dilatation of the cervix.  You may have your pubic hair shaved and be given an enema depending on your caregiver and the circumstance.  An electronic monitor is usually placed on your abdomen. The monitor follows the length and intensity of the contractions, as well as the baby's heart rate.  Usually, your caregiver will insert an IV in your arm with a bottle of sugar water. This is done as a precaution so that medications can be given to you quickly during labor or delivery. NORMAL LABOR AND DELIVERY IS DIVIDED UP INTO 3 STAGES: First Stage This is when regular contractions begin and the cervix begins to efface and dilate. This stage can last from 3 to 15 hours. The end of the first stage is when the cervix is 100% effaced and 10 centimeters dilated. Pain medications may be given by   Injection (morphine, demerol, etc.)  Regional anesthesia (spinal, caudal or epidural, anesthetics given in different locations of  the spine). Paracervical pain medication may be given, which is an injection of and anesthetic on each side of the cervix. A pregnant woman may request to have "Natural Childbirth" which is not to have any medications or anesthesia during her labor and delivery. Second Stage This is when the baby comes down through the birth canal (vagina) and is born. This can take 1 to 4 hours. As the baby's head comes down through the birth canal, you may feel like you are going   to have a bowel movement. You will get the urge to bear down and push until the baby is delivered. As the baby's head is being delivered, the caregiver will decide if an episiotomy (a cut in the perineum and vagina area) is needed to prevent tearing of the tissue in this area. The episiotomy is sewn up after the delivery of the baby and placenta. Sometimes a mask with nitrous oxide is given for the mother to breath during the delivery of the baby to help if there is too much pain. The end of Stage 2 is when the baby is fully delivered. Then when the umbilical cord stops pulsating it is clamped and cut. Third Stage The third stage begins after the baby is completely delivered and ends after the placenta (afterbirth) is delivered. This usually takes 5 to 30 minutes. After the placenta is delivered, a medication is given either by intravenous or injection to help contract the uterus and prevent bleeding. The third stage is not painful and pain medication is usually not necessary. If an episiotomy was done, it is repaired at this time. After the delivery, the mother is watched and monitored closely for 1 to 2 hours to make sure there is no postpartum bleeding (hemorrhage). If there is a lot of bleeding, medication is given to contract the uterus and stop the bleeding. Document Released: 12/28/2007 Document Revised: 12/13/2011 Document Reviewed: 12/28/2007 Exit[employer] Patient Information 2014 [employer].  

## 2013-01-08 NOTE — Progress Notes (Signed)
Braxton Hicks contractions-labor precautions-membranes stripped.

## 2013-01-08 NOTE — Assessment & Plan Note (Signed)
Continue routine prenatal care.  

## 2013-01-08 NOTE — Progress Notes (Signed)
P-95 

## 2013-01-08 NOTE — MAU Note (Signed)
Contractions today since 6pm. Denies LOF. Some bloody show since membranes swept this morning by Dr. Shawnie Pons. Was 2.5 cm this morning. Positive fetal movement.

## 2013-01-10 ENCOUNTER — Inpatient Hospital Stay (HOSPITAL_COMMUNITY)
Admission: AD | Admit: 2013-01-10 | Discharge: 2013-01-10 | Disposition: A | Discharge status: Home / Self Care | Payer: Medicaid Other | Source: Ambulatory Visit | Attending: Obstetrics & Gynecology | Admitting: Obstetrics & Gynecology

## 2013-01-10 ENCOUNTER — Encounter (HOSPITAL_COMMUNITY): Payer: Self-pay | Admitting: *Deleted

## 2013-01-10 DIAGNOSIS — O99891 Other specified diseases and conditions complicating pregnancy: Secondary | ICD-10-CM | POA: Insufficient documentation

## 2013-01-10 DIAGNOSIS — N898 Other specified noninflammatory disorders of vagina: Secondary | ICD-10-CM

## 2013-01-10 DIAGNOSIS — Z3400 Encounter for supervision of normal first pregnancy, unspecified trimester: Secondary | ICD-10-CM

## 2013-01-10 DIAGNOSIS — Z3403 Encounter for supervision of normal first pregnancy, third trimester: Secondary | ICD-10-CM

## 2013-01-10 LAB — URINALYSIS, ROUTINE W REFLEX MICROSCOPIC
Bilirubin Urine: NEGATIVE
Glucose, UA: NEGATIVE mg/dL
Ketones, ur: NEGATIVE mg/dL
Protein, ur: NEGATIVE mg/dL
pH: 6 (ref 5.0–8.0)

## 2013-01-10 LAB — URINE MICROSCOPIC-ADD ON

## 2013-01-10 NOTE — MAU Provider Note (Signed)
History     CSN: [id number]  Arrival date and time: 01/10/13 2046  Chief Complaint  Patient presents with  . Rupture of Membranes   HPI Pt is a 22 G1P0 who presents for concern of fluid leakage since 1600 this pm. [name] out on a long walk earlier. Then felt like she peed on herself. Intermittently feels fluid leakage since that time. No recent intercourse. Denies pain feeling some abdominal pressure, vaginal bleeding. Feeling active fetal movement. No contractions.   OB History   Grav Para Term Preterm Abortions TAB SAB Ect Mult Living   1 0 0 0 0 0 0 0 0 0     1. Current  Past Medical History  Diagnosis Date  . [id number](086.5)     Past Surgical History  Procedure Laterality Date  . No past surgeries      Family History  Problem Relation Age of Onset  . Alcohol abuse Neg Hx   . Arthritis Neg Hx   . Asthma Neg Hx   . Birth defects Neg Hx   . Cancer Neg Hx   . COPD Neg Hx   . Diabetes Neg Hx   . Depression Neg Hx   . Drug abuse Neg Hx   . Early death Neg Hx   . Hearing loss Neg Hx   . Heart disease Neg Hx   . Hyperlipidemia Neg Hx   . Hypertension Neg Hx   . Kidney disease Neg Hx   . Learning disabilities Neg Hx   . Mental illness Neg Hx   . Mental retardation Neg Hx   . Miscarriages / Stillbirths Neg Hx   . Stroke Neg Hx   . Vision loss Neg Hx     History  Substance Use Topics  . Smoking status: Never Smoker   . Smokeless tobacco: Never Used  . Alcohol Use: No    Allergies: No Known Allergies  Prescriptions prior to admission  Medication Sig Dispense Refill  . Prenatal Vit-Min-FA-Fish Oil ([employer] PRENATAL GUMMY PO) Take 2 each by mouth daily at 12 noon.        Review of Systems  Constitutional: Negative for fever and chills.  HENT: Negative for congestion and sore throat.   Eyes: Negative for blurred vision and pain.  Respiratory: Negative for cough and shortness of breath.   Cardiovascular: Negative for chest pain and palpitations.   Gastrointestinal: Positive for abdominal pain. Negative for heartburn, nausea and vomiting.  Genitourinary: Negative for dysuria, urgency and frequency.  Musculoskeletal: Negative for myalgias.  Skin: Negative for rash.  Neurological: Negative for dizziness, seizures, loss of consciousness, weakness and headaches.  Endo/Heme/Allergies: Does not bruise/bleed easily.  Psychiatric/Behavioral: Negative for depression.   Physical Exam   Blood pressure 122/85, pulse 94, temperature 98.7 F (37.1 C), temperature source Oral, resp. rate 18, height 5\' 2"  (1.575 m), weight 71.215 kg (157 lb), last menstrual period 04/05/2012, SpO2 100.00%.  Physical Exam  Constitutional: She is oriented to person, place, and time. She appears well-developed and well-nourished. No distress.  HENT:  Head: Normocephalic and atraumatic.  Eyes: EOM are normal.  Neck: Neck supple.  Cardiovascular: Normal rate.   Respiratory: Effort normal. No respiratory distress.  GI: Soft.  gravid  Genitourinary: Vaginal discharge found.  Musculoskeletal: Normal range of motion.  Neurological: She is alert and oriented to person, place, and time.  Skin: Skin is warm and dry. No erythema.  Psychiatric: She has a normal mood and affect. Her behavior is normal.   FHR-  baseline 130, mod variability, post accels, no decels MAU Course  Procedures  MDM Amnisure U/A  Assessment and Plan  Natalie Marks is 22y.o G1P0 at 40w who presents for r/o SROM  1.r/o ROM- no evidence of tachycardia, afebrile; no abdominal pain; some uterine irritability but no regular contraction pattern. Mucousy white vag discharge in vault. GCCT neg prev. Likely 2/2 normal vaginal discharge of pregnancy -Amnisure neg -u/a with some leuks but neg nitrites -discussed with pt signs of labor, given reassurance  2. FHR- reasssuring -cat I tracing  Anselm Lis 01/10/2013, 9:22 PM   I have participated in the care of this patient and I agree with the  above. Cam Hai 12:24 AM 01/11/2013

## 2013-01-10 NOTE — MAU Note (Signed)
Pt reports ? Leaking fluid since 1600. Denies pain.

## 2013-01-12 LAB — URINE CULTURE

## 2013-01-14 ENCOUNTER — Inpatient Hospital Stay (HOSPITAL_COMMUNITY): Payer: Medicaid Other | Admitting: Anesthesiology

## 2013-01-14 ENCOUNTER — Encounter (HOSPITAL_COMMUNITY): Payer: Self-pay | Admitting: *Deleted

## 2013-01-14 ENCOUNTER — Ambulatory Visit (INDEPENDENT_AMBULATORY_CARE_PROVIDER_SITE_OTHER): Payer: Medicaid Other | Admitting: Obstetrics & Gynecology

## 2013-01-14 ENCOUNTER — Inpatient Hospital Stay (HOSPITAL_COMMUNITY)
Admission: AD | Admit: 2013-01-14 | Discharge: 2013-01-17 | DRG: 775 | Disposition: A | Discharge status: Home / Self Care | Payer: Medicaid Other | Source: Ambulatory Visit | Attending: Obstetrics & Gynecology | Admitting: Obstetrics & Gynecology

## 2013-01-14 ENCOUNTER — Encounter (HOSPITAL_COMMUNITY): Payer: Medicaid Other | Admitting: Anesthesiology

## 2013-01-14 VITALS — BP 128/89 | Wt 155.0 lb

## 2013-01-14 DIAGNOSIS — O4290 Premature rupture of membranes, unspecified as to length of time between rupture and onset of labor, unspecified weeks of gestation: Secondary | ICD-10-CM

## 2013-01-14 DIAGNOSIS — Z3400 Encounter for supervision of normal first pregnancy, unspecified trimester: Secondary | ICD-10-CM

## 2013-01-14 DIAGNOSIS — Z3403 Encounter for supervision of normal first pregnancy, third trimester: Secondary | ICD-10-CM

## 2013-01-14 LAB — CBC
HCT: 29.9 % — ABNORMAL LOW (ref 36.0–46.0)
Hemoglobin: 9.7 g/dL — ABNORMAL LOW (ref 12.0–15.0)
MCH: 27.2 pg (ref 26.0–34.0)
MCV: 83.8 fL (ref 78.0–100.0)
RBC: 3.57 MIL/uL — ABNORMAL LOW (ref 3.87–5.11)
RDW: 14.4 % (ref 11.5–15.5)
WBC: 8.3 10*3/uL (ref 4.0–10.5)

## 2013-01-14 MED ORDER — LACTATED RINGERS IV SOLN: 500.0000 mL | Freq: Once | INTRAVENOUS | Status: DC

## 2013-01-14 MED ORDER — OXYTOCIN BOLUS FROM INFUSION
500.0000 mL | INTRAVENOUS | Status: DC
  Administered 2013-01-15: 500 mL via INTRAVENOUS

## 2013-01-14 MED ORDER — LIDOCAINE HCL (PF) 1 % IJ SOLN
INTRAMUSCULAR | Status: DC | PRN
  Administered 2013-01-14 (×2): 8 mL

## 2013-01-14 MED ORDER — OXYCODONE-ACETAMINOPHEN 5-325 MG PO TABS: 1.0000 | ORAL_TABLET | ORAL | Status: DC | PRN

## 2013-01-14 MED ORDER — FENTANYL 2.5 MCG/ML BUPIVACAINE 1/10 % EPIDURAL INFUSION (WH - ANES)
14.0000 mL/h | INTRAMUSCULAR | Status: DC | PRN
  Filled 2013-01-14: qty 125

## 2013-01-14 MED ORDER — TERBUTALINE SULFATE 1 MG/ML IJ SOLN: 0.2500 mg | Freq: Once | INTRAMUSCULAR | Status: AC | PRN

## 2013-01-14 MED ORDER — LACTATED RINGERS IV SOLN: 500.0000 mL | INTRAVENOUS | Status: DC | PRN

## 2013-01-14 MED ORDER — LIDOCAINE HCL (PF) 1 % IJ SOLN
30.0000 mL | INTRAMUSCULAR | Status: DC | PRN
  Filled 2013-01-14 (×2): qty 30

## 2013-01-14 MED ORDER — LACTATED RINGERS IV SOLN: INTRAVENOUS | Status: DC

## 2013-01-14 MED ORDER — OXYTOCIN 40 UNITS IN LACTATED RINGERS INFUSION - SIMPLE MED
1.0000 m[IU]/min | INTRAVENOUS | Status: DC
  Administered 2013-01-14: 2 m[IU]/min via INTRAVENOUS
  Filled 2013-01-14: qty 1000

## 2013-01-14 MED ORDER — EPHEDRINE 5 MG/ML INJ
10.0000 mg | INTRAVENOUS | Status: DC | PRN
  Filled 2013-01-14: qty 2

## 2013-01-14 MED ORDER — DIPHENHYDRAMINE HCL 50 MG/ML IJ SOLN: 12.5000 mg | INTRAMUSCULAR | Status: DC | PRN

## 2013-01-14 MED ORDER — OXYTOCIN 40 UNITS IN LACTATED RINGERS INFUSION - SIMPLE MED: 62.5000 mL/h | INTRAVENOUS | Status: DC

## 2013-01-14 MED ORDER — PHENYLEPHRINE 40 MCG/ML (10ML) SYRINGE FOR IV PUSH (FOR BLOOD PRESSURE SUPPORT)
80.0000 ug | PREFILLED_SYRINGE | INTRAVENOUS | Status: DC | PRN
  Filled 2013-01-14: qty 2

## 2013-01-14 MED ORDER — EPHEDRINE 5 MG/ML INJ
10.0000 mg | INTRAVENOUS | Status: DC | PRN
  Filled 2013-01-14: qty 2
  Filled 2013-01-14: qty 4

## 2013-01-14 MED ORDER — ACETAMINOPHEN 325 MG PO TABS: 650.0000 mg | ORAL_TABLET | ORAL | Status: DC | PRN

## 2013-01-14 MED ORDER — FENTANYL CITRATE 0.05 MG/ML IJ SOLN
100.0000 ug | INTRAMUSCULAR | Status: DC | PRN
  Administered 2013-01-14: 100 ug via INTRAVENOUS
  Filled 2013-01-14: qty 2

## 2013-01-14 MED ORDER — CITRIC ACID-SODIUM CITRATE 334-500 MG/5ML PO SOLN: 30.0000 mL | ORAL | Status: DC | PRN

## 2013-01-14 MED ORDER — IBUPROFEN 600 MG PO TABS
600.0000 mg | ORAL_TABLET | Freq: Four times a day (QID) | ORAL | Status: DC | PRN
  Administered 2013-01-15: 600 mg via ORAL
  Filled 2013-01-14: qty 1

## 2013-01-14 MED ORDER — FENTANYL 2.5 MCG/ML BUPIVACAINE 1/10 % EPIDURAL INFUSION (WH - ANES)
INTRAMUSCULAR | Status: DC | PRN
  Administered 2013-01-14: 14 mL/h via EPIDURAL

## 2013-01-14 MED ORDER — PHENYLEPHRINE 40 MCG/ML (10ML) SYRINGE FOR IV PUSH (FOR BLOOD PRESSURE SUPPORT)
80.0000 ug | PREFILLED_SYRINGE | INTRAVENOUS | Status: DC | PRN
  Filled 2013-01-14: qty 2
  Filled 2013-01-14: qty 5

## 2013-01-14 MED ORDER — ONDANSETRON HCL 4 MG/2ML IJ SOLN: 4.0000 mg | Freq: Four times a day (QID) | INTRAMUSCULAR | Status: DC | PRN

## 2013-01-14 NOTE — H&P (Signed)
Natalie Marks is a 22 y.o. female presenting for PROM and IOL. Pt ruptured at 245 today and was evaluated in clinic confirmed. Pt was unchagned from prior exam. Pt has no complaints other than leakage of fluid.   +FM, +LOF, NO FB, No Ctx   Maternal Medical History:  Reason for admission: Nausea.    OB History   Grav Para Term Preterm Abortions TAB SAB Ect Mult Living   1 0 0 0 0 0 0 0 0 0      Past Medical History  Diagnosis Date  . ZOXWRUEA(540.9)    Past Surgical History  Procedure Laterality Date  . No past surgeries     Family History: family history is negative for Alcohol abuse, Arthritis, Asthma, Birth defects, Cancer, COPD, Diabetes, Depression, Drug abuse, Early death, Hearing loss, Heart disease, Hyperlipidemia, Hypertension, Kidney disease, Learning disabilities, Mental illness, Mental retardation, Miscarriages / Stillbirths, Stroke, and Vision loss. Social History:  reports that she has never smoked. She has never used smokeless tobacco. She reports that she does not drink alcohol or use illicit drugs.   Clinic  [facility]  Genetic Screen  Neg AFP  Anatomic US  WNL  Glucose Screen  84  TDaP vaccine 10/23/12  GBS  negative  Baby Food Breast  Contraception  Paraguard  Circumcision N/A - girl   Prenatal Transfer Tool  Maternal Diabetes: No Genetic Screening: Normal Maternal Ultrasounds/Referrals: Normal Fetal Ultrasounds or other Referrals:  None Maternal Substance Abuse:  No Significant Maternal Medications:  None Significant Maternal Lab Results:  Lab values include: Group B Strep negative Other Comments:  None  Review of Systems  Constitutional: Negative for fever and chills.  Eyes: Negative for blurred vision.  Respiratory: Negative for cough.   Cardiovascular: Negative for chest pain.  Gastrointestinal: Negative for heartburn, nausea, vomiting, abdominal pain, diarrhea and constipation.  Genitourinary: Negative for dysuria.  Musculoskeletal:  Negative for myalgias.  Neurological: Negative for headaches.    Dilation: 3 Effacement (%): 50 Station: -2 Exam by:: Dr. Ike Bene Blood pressure 118/72, pulse 83, temperature 99 F (37.2 C), temperature source Oral, resp. rate 20, height 5\' 2"  (1.575 m), weight 70.308 kg (155 lb), last menstrual period 04/05/2012. Exam Physical Exam  Nursing note and vitals reviewed. Constitutional: She is oriented to person, place, and time. She appears well-developed and well-nourished. No distress.  HENT:  Head: Normocephalic and atraumatic.  Eyes: Conjunctivae and EOM are normal.  Neck: Normal range of motion.  Cardiovascular: Normal rate, regular rhythm, normal heart sounds and intact distal pulses.  Exam reveals no gallop and no friction rub.   No murmur heard. Respiratory: Effort normal and breath sounds normal. No respiratory distress. She has no wheezes. She has no rales. She exhibits no tenderness.  GI: Soft. Bowel sounds are normal. She exhibits no distension (leopold 3200g) and no mass. There is no tenderness. There is no rebound and no guarding.  Genitourinary: Vagina normal.  Musculoskeletal: Normal range of motion.  Neurological: She is alert and oriented to person, place, and time. No cranial nerve deficit. Coordination normal.  Skin: She is not diaphoretic.  Psychiatric: She has a normal mood and affect. Her behavior is normal. Judgment and thought content normal.    Prenatal labs: ABO, Rh: O/POS/-- (05/02 1124) Antibody: NEG (05/02 1124) Rubella: 1.71 (05/02 1124) RPR: NON REAC (07/23 1115)  HBsAg: NEGATIVE (05/02 1124)  HIV: NON REACTIVE (07/23 1115)  GBS: Negative (09/20 0000)   FHT: 120s mod var mult accesl no decels  No ctx  Assessment/Plan: Natalie Marks is a 22 y.o. G1P0000 at [redacted]w[redacted]d presents for IOL for PROM   #Labor: BIshop score 7, will start pitocin #Pain: desires IV pain meds and epidural #FWB: Cat I #ID: GBS neg #MOF: Breast #MOC: Paragard  Natalie Marks  RYAN 01/14/2013, 6:13 PM

## 2013-01-14 NOTE — Patient Instructions (Signed)
Return to clinic for any obstetric concerns or go to [facility]U for evaluation  

## 2013-01-14 NOTE — Progress Notes (Signed)
P-86.  Pt thinks her water is leaking.

## 2013-01-14 NOTE — Anesthesia Preprocedure Evaluation (Signed)
Anesthesia Evaluation  Patient identified by MRN, date of birth, ID band Patient awake    Reviewed: Allergy & Precautions, H&P , NPO status , Patient's Chart, lab work & pertinent test results  Airway Mallampati: I TM Distance: >3 FB Neck ROM: full    Dental no notable dental hx.    Pulmonary neg pulmonary ROS,    Pulmonary exam normal       Cardiovascular negative cardio ROS      Neuro/Psych negative psych ROS   GI/Hepatic negative GI ROS, Neg liver ROS,   Endo/Other  negative endocrine ROS  Renal/GU negative Renal ROS  negative genitourinary   Musculoskeletal negative musculoskeletal ROS (+)   Abdominal Normal abdominal exam  (+)   Peds  Hematology negative hematology ROS (+)   Anesthesia Other Findings   Reproductive/Obstetrics (+) Pregnancy                           Anesthesia Physical Anesthesia Plan  ASA: II  Anesthesia Plan: Epidural   Post-op Pain Management:    Induction:   Airway Management Planned:   Additional Equipment:   Intra-op Plan:   Post-operative Plan:   Informed Consent: I have reviewed the patients History and Physical, chart, labs and discussed the procedure including the risks, benefits and alternatives for the proposed anesthesia with the patient or authorized representative who has indicated his/her understanding and acceptance.     Plan Discussed with:   Anesthesia Plan Comments:         Anesthesia Quick Evaluation

## 2013-01-14 NOTE — Anesthesia Procedure Notes (Signed)
Epidural Patient location during procedure: OB Start time: 01/14/2013 10:52 PM End time: 01/14/2013 10:56 PM  Staffing Anesthesiologist: Sandrea Hughs Performed by: anesthesiologist   Preanesthetic Checklist Completed: patient identified, surgical consent, pre-op evaluation, timeout performed, IV checked, risks and benefits discussed and monitors and equipment checked  Epidural Patient position: sitting Prep: site prepped and draped and DuraPrep Patient monitoring: continuous pulse ox and blood pressure Approach: midline Injection technique: LOR air  Needle:  Needle type: Tuohy  Needle gauge: 17 G Needle length: 9 cm and 9 Needle insertion depth: 5 cm cm Catheter type: closed end flexible Catheter size: 19 Gauge Catheter at skin depth: 10 cm Test dose: negative and Other  Assessment Sensory level: T9 Events: blood not aspirated, injection not painful, no injection resistance, negative IV test and no paresthesia  Additional Notes Reason for block:procedure for pain

## 2013-01-14 NOTE — Progress Notes (Signed)
On exam, patient is grossly ruptured for clear fluid +pool, + nitrazine. Feeling pressure, rare contractions, cervix 3/75/-3/vertex on sterile digital exam.  Patient will go to [facility] for admission now.  On call staff, L&D notified.

## 2013-01-15 ENCOUNTER — Encounter (HOSPITAL_COMMUNITY): Payer: Self-pay | Admitting: Family Medicine

## 2013-01-15 ENCOUNTER — Encounter: Payer: Medicaid Other | Admitting: Obstetrics and Gynecology

## 2013-01-15 DIAGNOSIS — O4290 Premature rupture of membranes, unspecified as to length of time between rupture and onset of labor, unspecified weeks of gestation: Secondary | ICD-10-CM

## 2013-01-15 MED ORDER — OXYCODONE-ACETAMINOPHEN 5-325 MG PO TABS: 1.0000 | ORAL_TABLET | ORAL | Status: DC | PRN

## 2013-01-15 MED ORDER — TETANUS-DIPHTH-ACELL PERTUSSIS 5-2.5-18.5 LF-MCG/0.5 IM SUSP: 0.5000 mL | Freq: Once | INTRAMUSCULAR | Status: DC

## 2013-01-15 MED ORDER — ZOLPIDEM TARTRATE 5 MG PO TABS: 5.0000 mg | ORAL_TABLET | Freq: Every evening | ORAL | Status: DC | PRN

## 2013-01-15 MED ORDER — LANOLIN HYDROUS EX OINT: TOPICAL_OINTMENT | CUTANEOUS | Status: DC | PRN

## 2013-01-15 MED ORDER — ONDANSETRON HCL 4 MG PO TABS: 4.0000 mg | ORAL_TABLET | ORAL | Status: DC | PRN

## 2013-01-15 MED ORDER — SENNOSIDES-DOCUSATE SODIUM 8.6-50 MG PO TABS
2.0000 | ORAL_TABLET | ORAL | Status: DC
  Administered 2013-01-15 – 2013-01-17 (×2): 2 via ORAL
  Filled 2013-01-15: qty 2
  Filled 2013-01-15: qty 21

## 2013-01-15 MED ORDER — ONDANSETRON HCL 4 MG/2ML IJ SOLN: 4.0000 mg | INTRAMUSCULAR | Status: DC | PRN

## 2013-01-15 MED ORDER — SIMETHICONE 80 MG PO CHEW: 80.0000 mg | CHEWABLE_TABLET | ORAL | Status: DC | PRN

## 2013-01-15 MED ORDER — IBUPROFEN 600 MG PO TABS
600.0000 mg | ORAL_TABLET | Freq: Four times a day (QID) | ORAL | Status: DC
  Administered 2013-01-15 – 2013-01-17 (×9): 600 mg via ORAL
  Filled 2013-01-15 (×9): qty 1

## 2013-01-15 MED ORDER — PRENATAL MULTIVITAMIN CH
1.0000 | ORAL_TABLET | Freq: Every day | ORAL | Status: DC
  Administered 2013-01-15 – 2013-01-17 (×3): 1 via ORAL
  Filled 2013-01-15 (×4): qty 1

## 2013-01-15 MED ORDER — WITCH HAZEL-GLYCERIN EX PADS: 1.0000 "application " | MEDICATED_PAD | CUTANEOUS | Status: DC | PRN

## 2013-01-15 MED ORDER — BENZOCAINE-MENTHOL 20-0.5 % EX AERO
1.0000 "application " | INHALATION_SPRAY | CUTANEOUS | Status: DC | PRN
  Administered 2013-01-15: 1 via TOPICAL
  Filled 2013-01-15: qty 56

## 2013-01-15 MED ORDER — DIPHENHYDRAMINE HCL 25 MG PO CAPS: 25.0000 mg | ORAL_CAPSULE | Freq: Four times a day (QID) | ORAL | Status: DC | PRN

## 2013-01-15 MED ORDER — DIBUCAINE 1 % RE OINT: 1.0000 "application " | TOPICAL_OINTMENT | RECTAL | Status: DC | PRN

## 2013-01-15 NOTE — Progress Notes (Signed)
Natalie Marks is a 22 y.o. female presenting for PROM and IOL. Pt ruptured at 245 today and was evaluated in clinic confirmed.    Subjective: Patient has been feeling increased pressure and pushing for 30 minutes. She is not moving baby well with pushes and would like to rest and conserve her energy.   Objective: BP 131/84  Pulse 117  Temp(Src) 98.4 F (36.9 C) (Oral)  Resp 16  Ht 5\' 2"  (1.575 m)  Wt 70.308 kg (155 lb)  BMI 28.34 kg/m2  SpO2 99%  LMP 04/05/2012   Total I/O In: -  Out: 300 [Urine:300]  FHT:  FHR: 130 bpm, variability: moderate,  accelerations:  Present,  decelerations:  Present with contracts UC:   regular, every 1-2 minutes SVE:   Dilation: 10 Effacement (%): 100 Station: +1 Exam by:: T. Sprague RN  Labs: Lab Results  Component Value Date   WBC 8.3 01/14/2013   HGB 9.7* 01/14/2013   HCT 29.9* 01/14/2013   MCV 83.8 01/14/2013   PLT 186 01/14/2013    Assessment / Plan: Spontaneous labor, progressing normally  Labor: Progressing normally ; will hold pushing for now. Re-start when patient has increased pressure.  Preeclampsia:  no signs or symptoms of toxicity Fetal Wellbeing:  Category I and Category II, with pushing CAT 2 Pain Control:  Epidural I/D:  n/a Anticipated MOD:  NSVD  Kuneff, Renee 01/15/2013, 1:32 AM  Seen and agree Aviva Signs, CNM

## 2013-01-15 NOTE — Anesthesia Postprocedure Evaluation (Signed)
  Anesthesia Post-op Note  Patient: Natalie Marks  Procedure(s) Performed: * No procedures listed *  Patient Location: Mother/Baby  Anesthesia Type:Epidural  Level of Consciousness: awake, alert , oriented and patient cooperative  Airway and Oxygen Therapy: Patient Spontanous Breathing  Post-op Pain: mild  Post-op Assessment: Patient's Cardiovascular Status Stable, Respiratory Function Stable, No headache, No backache, No residual numbness and No residual motor weakness  Post-op Vital Signs: stable  Complications: No apparent anesthesia complications

## 2013-01-15 NOTE — Progress Notes (Signed)
Patient ID: Natalie Marks, female   DOB: 1990-04-26, 22 y.o.   MRN: [id number]  Addendum to Delivery Note:   All needle and sponge counts correct.

## 2013-01-16 MED ORDER — IBUPROFEN 600 MG PO TABS: 600.0000 mg | ORAL_TABLET | Freq: Four times a day (QID) | ORAL | Status: DC

## 2013-01-16 NOTE — Progress Notes (Signed)
Post Partum Day 1 Subjective: no complaints, up ad lib, voiding, tolerating PO and + flatus  Objective: Blood pressure 95/53, pulse 88, temperature 98.2 F (36.8 C), temperature source Oral, resp. rate 19, height 5\' 2"  (1.575 m), weight 70.308 kg (155 lb), last menstrual period 04/05/2012, SpO2 99.00%, unknown if currently breastfeeding.  Physical Exam:  General: alert, cooperative, appears stated age and no distress Lochia: appropriate Uterine Fundus: firm DVT Evaluation: No evidence of DVT seen on physical exam. Negative Homan's sign. No cords or calf tenderness. No significant calf/ankle edema.   Recent Labs  01/14/13 1745  HGB 9.7*  HCT 29.9*    Assessment/Plan: Plan for discharge tomorrow, Breastfeeding, Lactation consult and Contraception paragard   LOS: 2 days   Edynn Gillock RYAN 01/16/2013, 2:22 PM

## 2013-01-16 NOTE — Discharge Summary (Signed)
Obstetric Discharge Summary Reason for Admission: induction of labor 2/2 PROM Prenatal Procedures: none Intrapartum Procedures: spontaneous vaginal delivery Postpartum Procedures: none Complications-Operative and Postpartum: 2nd degree perineal laceration, periurethral, right vaginal sulcus Hemoglobin  Date Value Range Status  01/14/2013 9.7* 12.0 - 15.0 g/dL Final     HCT  Date Value Range Status  01/14/2013 29.9* 36.0 - 46.0 % Final   Filed Vitals:   01/16/13 0459  BP: 95/53  Pulse: 88  Temp: 98.2 F (36.8 C)  Resp: 19    Physical Exam:  General: alert, cooperative and no distress Lochia: appropriate Uterine Fundus: firm below the umbilicus Incision: dehiscence present n/a DVT Evaluation: No evidence of DVT seen on physical exam.  Discharge Diagnoses: Term Pregnancy-delivered  Discharge Information: Date: 01/16/2013 Activity: unrestricted Diet: routine Medications: Ibuprofen Condition: stable Instructions: refer to practice specific booklet Discharge to: home   Newborn Data: Live born female  Birth Weight: 7 lb 11.4 oz (3498 g) APGAR: 8, 9  Home with mother.  Pt is a 21y.o G1P1001 at 40w5 who presented for IOL 2/2 PROM. Patient progressed well without augmentation. Pt pushed for 2 hrs at which time there was molding but no caput. Head was brought low enough to attempt vacuum assisted delivery. Kiwi vacuum applied to the vertex, 3 pulls with no pop-offs. No shoulders.Nuchal X1 clamped and cut prior to delivery. Baby was delivered with instant cry. Delivery complicated by periurethral, right vaginal sulcus and 2nd degree perineal tears repaired with 3.0 vicryl. EBL 400. Mother and baby both did well postpartum. Pt plans to breast feed and use paraguard for contraception. Will f/up in 4-6 at [facility] for postpartum visit.   Anselm Lis 01/16/2013, 7:43 AM  I spoke with and examined patient and agree with resident's note and plan of care. However, based on discussion  with family, would prefer to stay an extra day to work on latching with pediatrics. Tawana Scale, MD OB Fellow 01/16/2013 2:15 PM

## 2013-01-17 NOTE — Discharge Summary (Signed)
Obstetric Discharge Summary Reason for Admission: rupture of membranes Prenatal Procedures: NST Intrapartum Procedures: vacuum Postpartum Procedures: none Complications-Operative and Postpartum: 2nd degree perineal laceration Hemoglobin  Date Value Range Status  01/14/2013 9.7* 12.0 - 15.0 g/dL Final     HCT  Date Value Range Status  01/14/2013 29.9* 36.0 - 46.0 % Final    Physical Exam:  General: alert, cooperative and no distress Lochia: appropriate Uterine Fundus: firm Incision: healing well DVT Evaluation: No evidence of DVT seen on physical exam. No cords or calf tenderness.  Discharge Diagnoses: Term Pregnancy-delivered  Discharge Information: Date: 01/17/2013 Activity: pelvic rest Diet: routine Medications: PNV and Ibuprofen Condition: improved Instructions: refer to practice specific booklet Discharge to: home Follow-up Information   [facility] up with [facility]. Schedule an appointment as soon as possible for a visit in 4 weeks. (for post partum visit and paraguard)    Specialty:  Obstetrics and Gynecology   Contact information:   [address] [place] [address] [phone]      Newborn Data: Live born female  Birth Weight: 7 lb 11.4 oz (3498 g) APGAR: 8, 9  Home with mother.\  Pt presented with SROM and with augmentation progressed to complete and pushed for 2 hours.  She was then assisted via KIWI vacuum to deliver a liveborn female.  She had a periurethral, right sulcal and 2nd degree perineal repair that went well. Post partum care uncomplicated. She is breast feeding and desires copper IUD.   Natalie Marks 01/17/2013, 9:12 AM

## 2013-01-17 NOTE — Discharge Summary (Signed)
Attestation of Attending Supervision of Advanced Practitioner (CNM/NP): Evaluation and management procedures were performed by the Advanced Practitioner under my supervision and collaboration.  I have reviewed the Advanced Practitioner's note and chart, and I agree with the management and plan.  Fiorela Pelzer 01/17/2013 10:40 AM

## 2013-01-18 ENCOUNTER — Inpatient Hospital Stay (HOSPITAL_COMMUNITY)
Admission: AD | Admit: 2013-01-18 | Discharge: 2013-01-18 | Disposition: A | Discharge status: Home / Self Care | Payer: Medicaid Other | Source: Ambulatory Visit | Attending: Family Medicine | Admitting: Family Medicine

## 2013-01-18 DIAGNOSIS — D649 Anemia, unspecified: Secondary | ICD-10-CM | POA: Insufficient documentation

## 2013-01-18 DIAGNOSIS — N949 Unspecified condition associated with female genital organs and menstrual cycle: Secondary | ICD-10-CM | POA: Insufficient documentation

## 2013-01-18 DIAGNOSIS — O9081 Anemia of the puerperium: Secondary | ICD-10-CM | POA: Insufficient documentation

## 2013-01-18 DIAGNOSIS — R42 Dizziness and giddiness: Secondary | ICD-10-CM | POA: Insufficient documentation

## 2013-01-18 LAB — COMPREHENSIVE METABOLIC PANEL
Albumin: 2.5 g/dL — ABNORMAL LOW (ref 3.5–5.2)
Alkaline Phosphatase: 108 U/L (ref 39–117)
BUN: 12 mg/dL (ref 6–23)
CO2: 24 mEq/L (ref 19–32)
Calcium: 8.6 mg/dL (ref 8.4–10.5)
Creatinine, Ser: 0.68 mg/dL (ref 0.50–1.10)
GFR calc non Af Amer: >90 mL/min (ref >90)
Potassium: 3.6 mEq/L (ref 3.5–5.1)
Total Protein: 6.1 g/dL (ref 6.0–8.3)

## 2013-01-18 LAB — CBC WITH DIFFERENTIAL/PLATELET
Basophils Relative: 0 % (ref 0–1)
Eosinophils Absolute: 0.2 10*3/uL (ref 0.0–0.7)
Hemoglobin: 7.6 g/dL — ABNORMAL LOW (ref 12.0–15.0)
Lymphs Abs: 2 10*3/uL (ref 0.7–4.0)
MCH: 27.5 pg (ref 26.0–34.0)
MCHC: 32.6 g/dL (ref 30.0–36.0)
Monocytes Relative: 6 % (ref 3–12)
Neutrophils Relative %: 72 % (ref 43–77)
WBC: 10 10*3/uL (ref 4.0–10.5)

## 2013-01-18 MED ORDER — TRAMADOL HCL 50 MG PO TABS: 50.0000 mg | ORAL_TABLET | Freq: Four times a day (QID) | ORAL | Status: DC | PRN

## 2013-01-18 MED ORDER — FERROUS SULFATE 325 (65 FE) MG PO TABS: 325.0000 mg | ORAL_TABLET | Freq: Two times a day (BID) | ORAL | Status: DC

## 2013-01-18 NOTE — MAU Provider Note (Signed)
History     CSN: [id number]  Arrival date and time: 01/18/13 2041   None     Chief Complaint  Patient presents with  . Dizziness  . Nausea  . Postpartum Complications   HPI Natalie Marks is a 22 y.o. G1P1001 3d s/p VAVD w/ repair of periurethral, Rt sulcus, and 2nd deg perineal lac, now w/ report of sharp vaginal pains today ibuprofen not helping 'feels like they're putting stitches in me', lochia had been dark/brownish but has been a little heavier and red today, dizziness and nausea when walking in [employer] earlier. No pain, nausea, dizziness at present time. Denies malodorous lochia, fever/chills, uti s/s, ha, scotomata, ruq/epigastric pain, vomiting. Reports adequate po liquid and food intake. Breastfeeding w/o complications. Hgb 9.7 01/14/13 prior to delivery, EBL per delivery note.  No complications during pregnancy.    OB History   Grav Para Term Preterm Abortions TAB SAB Ect Mult Living   1 1 1  0 0 0 0 0 0 1      Past Medical History  Diagnosis Date  . WUJWJXBJ(478.2)     Past Surgical History  Procedure Laterality Date  . No past surgeries      Family History  Problem Relation Age of Onset  . Alcohol abuse Neg Hx   . Arthritis Neg Hx   . Asthma Neg Hx   . Birth defects Neg Hx   . Cancer Neg Hx   . COPD Neg Hx   . Diabetes Neg Hx   . Depression Neg Hx   . Drug abuse Neg Hx   . Early death Neg Hx   . Hearing loss Neg Hx   . Heart disease Neg Hx   . Hyperlipidemia Neg Hx   . Hypertension Neg Hx   . Kidney disease Neg Hx   . Learning disabilities Neg Hx   . Mental illness Neg Hx   . Mental retardation Neg Hx   . Miscarriages / Stillbirths Neg Hx   . Stroke Neg Hx   . Vision loss Neg Hx     History  Substance Use Topics  . Smoking status: Never Smoker   . Smokeless tobacco: Never Used  . Alcohol Use: No    Allergies: No Known Allergies  Prescriptions prior to admission  Medication Sig Dispense Refill  . ibuprofen (ADVIL,MOTRIN) 600  MG tablet Take 1 tablet (600 mg total) by mouth every 6 (six) hours.  30 tablet  0  . Prenatal Vit-Min-FA-Fish Oil ([employer] PRENATAL GUMMY PO) Take 2 each by mouth daily at 12 noon.        Review of Systems  Constitutional: Negative.  Negative for fever and chills.  Eyes: Negative.   Respiratory: Negative.   Cardiovascular: Negative.   Gastrointestinal: Positive for nausea.  Genitourinary: Negative.   Musculoskeletal: Negative.   Skin: Negative.   Neurological: Positive for dizziness.  Endo/Heme/Allergies: Negative.   Psychiatric/Behavioral: Negative.    Physical Exam   Blood pressure 127/66, pulse 112, temperature 98.3 F (36.8 C), temperature source Oral, resp. rate 18, height 5\' 2"  (1.575 m), weight 65.772 kg (145 lb), last menstrual period 04/05/2012, SpO2 100.00%, unknown if currently breastfeeding.  Lying: 95621, 91 Sitting: 30865, 111 Standing: 78469, 112  Physical Exam  Constitutional: She is oriented to person, place, and time. She appears well-developed and well-nourished.  HENT:  Head: Normocephalic.  Neck: Normal range of motion.  Cardiovascular: Normal rate.   Respiratory: Effort normal.  GI: Soft. There is no tenderness.  FF u-4  Genitourinary: Vagina normal.  Sutures appear intact, edges of lacs well approximated Normal small amount lochia rubra, no foul odor  Musculoskeletal: Normal range of motion.  Neurological: She is alert and oriented to person, place, and time. She has normal reflexes.  No clonus  Skin: Skin is warm and dry.  Psychiatric: She has a normal mood and affect. Her behavior is normal. Judgment and thought content normal.    MAU Course  Procedures  CBC, CMP Exam Orthostatic bp's  Results for orders placed during the hospital encounter of 01/18/13 (from the past 24 hour(s))  CBC WITH DIFFERENTIAL     Status: Abnormal   Collection Time    01/18/13  9:59 PM      Result Value Range   WBC 10.0  4.0 - 10.5 K/uL   RBC 2.76 (*) 3.87 - 5.11  MIL/uL   Hemoglobin 7.6 (*) 12.0 - 15.0 g/dL   HCT 40.9 (*) 81.1 - 91.4 %   MCV 84.4  78.0 - 100.0 fL   MCH 27.5  26.0 - 34.0 pg   MCHC 32.6  30.0 - 36.0 g/dL   RDW 78.2  95.6 - 21.3 %   Platelets 230  150 - 400 K/uL   Neutrophils Relative % 72  43 - 77 %   Neutro Abs 7.2  1.7 - 7.7 K/uL   Lymphocytes Relative 20  12 - 46 %   Lymphs Abs 2.0  0.7 - 4.0 K/uL   Monocytes Relative 6  3 - 12 %   Monocytes Absolute 0.6  0.1 - 1.0 K/uL   Eosinophils Relative 2  0 - 5 %   Eosinophils Absolute 0.2  0.0 - 0.7 K/uL   Basophils Relative 0  0 - 1 %   Basophils Absolute 0.0  0.0 - 0.1 K/uL  COMPREHENSIVE METABOLIC PANEL     Status: Abnormal   Collection Time    01/18/13  9:59 PM      Result Value Range   Sodium 139  135 - 145 mEq/L   Potassium 3.6  3.5 - 5.1 mEq/L   Chloride 105  96 - 112 mEq/L   CO2 24  19 - 32 mEq/L   Glucose, Bld 112 (*) 70 - 99 mg/dL   BUN 12  6 - 23 mg/dL   Creatinine, Ser 0.86  0.50 - 1.10 mg/dL   Calcium 8.6  8.4 - 57.8 mg/dL   Total Protein 6.1  6.0 - 8.3 g/dL   Albumin 2.5 (*) 3.5 - 5.2 g/dL   AST 33  0 - 37 U/L   ALT 35  0 - 35 U/L   Alkaline Phosphatase 108  39 - 117 U/L   Total Bilirubin 0.2 (*) 0.3 - 1.2 mg/dL   GFR calc non Af Amer >90  >90 mL/min   GFR calc Af Amer >90  >90 mL/min    2330: discussed w/ Dr. Shawnie Pons, no need for transfusion at present, OK to d/c home on po Fe Assessment and Plan  A:  3d s/p VAVD w/ repair of periurethral, sulcus, and 2nd deg perineal lacs  Anemia             Breastfeeding  Vaginal pain not r/b ibuprofen P:  D/C home  Rx Ferrous sulfate BID   Rx ultram #20 w/ 0RF  Constipation prevention/relief measures  Reviewed warning s/s to report  F/U at [facility] in ~5wks for pp visit  Marge Duncans 01/18/2013, 9:44 PM

## 2013-01-18 NOTE — MAU Note (Signed)
Patient breastfeeding. Will complete RN assessment when patient is finished.

## 2013-01-18 NOTE — MAU Note (Signed)
4 days postpartum. Complaints of dizziness and nausea for the last 2 hours. Ate 30 mins prior to onset of dizziness. Patient also states pain increased this morning and bleeding is a brighter red compared to yesterday.

## 2013-01-19 NOTE — MAU Provider Note (Signed)
Chart reviewed and agree with management and plan.  

## 2013-01-20 NOTE — H&P (Signed)
Attestation of Attending Supervision of Fellow: Evaluation and management procedures were performed by the Fellow under my supervision and collaboration.  I have reviewed the Fellow's note and chart, and I agree with the management and plan.    

## 2013-01-20 NOTE — Discharge Summary (Signed)
Attestation of Attending Supervision of Advanced Practitioner (CNM/NP): Evaluation and management procedures were performed by the Advanced Practitioner under my supervision and collaboration.  I have reviewed the Advanced Practitioner's note and chart, and I agree with the management and plan.  Natalie Marks 01/20/2013 2:38 PM

## 2013-01-31 ENCOUNTER — Ambulatory Visit (HOSPITAL_COMMUNITY)
Admission: RE | Admit: 2013-01-31 | Discharge: 2013-01-31 | Disposition: A | Discharge status: Home / Self Care | Payer: Medicaid Other | Source: Ambulatory Visit | Attending: Family Medicine | Admitting: Family Medicine

## 2013-01-31 NOTE — Lactation Note (Addendum)
Infant Lactation Consultation Outpatient Visit Note  Patient Name: Natalie Marks   Baby: Dorothea Glassman Date of Birth: 1990/10/02    DOB: [birth year]        BW: 7# 11.4oz (3498g)        Today's weight: 7# 1.4oz (8.1% below BW)  Breastfeeding History Frequency of Breastfeeding: q2h Length of Feeding: only swallows for a few minutes Voids: lots of wets  Stools: greenish/4 in last 24 hrs.  Did have yellow stools for a short period of time before turning green  Supplementing / Method: Pumping:  Type of Pump: Evenflo (single electric)   Frequency: 6 times/day for 15 min/each side (8 hr span at night)  Volume: 1oz-1.5 (total)  Comments: Gives EBM back via bottle twice/day   Consultation Evaluation:  Initial Feeding Assessment: Pre-feed [id number] Post-feed Weight: 3236g Amount Transferred: 22mL Comments:L breast,  Additional Feeding Assessment: Pre-feed [id number] Post-feed [id number]:[id number] Amount Transferred: 20mL Comments: R breast, 1108  Additional Feeding Assessment: Pre-feed Weight: 3256g Post-feed Weight: 3260g Amount Transferred: 4 mL Comments: L breast, 10 min, SNS added, but no intake  Additional Feeding Assessment: Pre-feed Weight: 3260g Post-feed Weight: 3292g Amount Transferred: 7 (+62ml of formula) Comments: R breast, 7 min  Total Breast milk Transferred this Visit: 53 Total Supplement Given: 25mL (via SNS)   Follow-Up Baby is 55 weeks old and at 8.1% below BW.  Baby is not jaundiced. Mom's R breast is larger than her L.  Both breasts exhibit stretch marks and good vein markings, both signs of +breast changes during pregnancy.  However, her breasts are not as full as one would expect.  Mom has not yet pumped today.  Mom concurs that her breasts never feel fuller than they do currently.    Baby has a beautiful latch.  Baby does nurse for about 5 min and then falls asleep.  An SNS was added after baby had gone to both breasts.  Baby took 25 mL of  formula.   Mom reports that she came to MAU 4 days after the birth b/c of dizziness.  Her Hgb was 7.6.  She took the Fe tablet for a few days, but then discontinued it b/c she did not like how it made her feel.  Mom has not her Hgb rechecked since (01-18-13).   My impression is that Mom's milk supply may have taken a hit from having been anemic.  I believe that w/using a stronger pump & rectifying the anemia, mom will be able to increase her milk supply. Mom did get a [employer] Loaner.  Plan:  1. Feed baby on both sides, then add SNS to breast, (putting 30mL of formula or EBM in SNS).  2. Pump 4-6 times/day for 15-20 min 3. Call OB to get Hgb level rechecked. 4. Alfalfa (as a galactagogue & to improve Fe intake).  Start w/1 capsule at a time before increasing dosage.  5. Mom to have baby reweighed by home visit nurse beginning of next week.     Remigio Eisenmenger, RN, IBCLC 01/31/2013, 10:44 AM

## 2013-02-03 ENCOUNTER — Ambulatory Visit (HOSPITAL_COMMUNITY): Payer: Medicaid Other

## 2013-02-06 ENCOUNTER — Other Ambulatory Visit: Payer: Self-pay

## 2013-03-05 ENCOUNTER — Encounter: Payer: Self-pay | Admitting: Obstetrics and Gynecology

## 2013-03-05 ENCOUNTER — Ambulatory Visit (INDEPENDENT_AMBULATORY_CARE_PROVIDER_SITE_OTHER): Payer: Medicaid Other | Admitting: Obstetrics and Gynecology

## 2013-03-05 NOTE — Progress Notes (Signed)
  Subjective:     Natalie Marks is a 22 y.o. female who presents for a postpartum visit. She is 6 weeks postpartum following a vacuum assisted vaginal delivery. I have fully reviewed the prenatal and intrapartum course. The delivery was at 40.4 gestational weeks. Outcome: vacuum, low. Anesthesia: epidural. Postpartum course has been uncomplicated. Baby's course has been uncomplicated. Baby is feeding by breast/formula (50/50). Bleeding no bleeding. Bowel function is normal. Bladder function is normal. Patient is not sexually active. Contraception method is none. Postpartum depression screening: negative.     Review of Systems A comprehensive review of systems was negative.   Objective:    There were no vitals taken for this visit.  General:  alert, cooperative and no distress   Breasts:  inspection negative, no nipple discharge or bleeding, no masses or nodularity palpable  Lungs: clear to auscultation bilaterally  Heart:  regular rate and rhythm  Abdomen: soft, non-tender; bowel sounds normal; no masses,  no organomegaly   Vulva:  normal  Vagina: normal vagina, no discharge, exudate, lesion, or erythema  Cervix:  multiparous appearance  Corpus: normal size, contour, position, consistency, mobility, non-tender  Adnexa:  no mass, fullness, tenderness  Rectal Exam: Not performed.        Assessment:     Normal postpartum exam. Pap smear not done at today's visit.   Plan:    1. Contraception: IUD 2. RTC in the next few days for Paraguard IUD insertion. Patient advised to remain abstinent until insertion 3. Follow up in: a few days or as needed.

## 2013-03-11 ENCOUNTER — Ambulatory Visit (INDEPENDENT_AMBULATORY_CARE_PROVIDER_SITE_OTHER): Payer: Medicaid Other | Admitting: Obstetrics & Gynecology

## 2013-03-11 ENCOUNTER — Encounter: Payer: Self-pay | Admitting: Obstetrics & Gynecology

## 2013-03-11 VITALS — BP 92/61 | HR 99 | Ht 62.0 in | Wt 127.0 lb

## 2013-03-11 DIAGNOSIS — Z30430 Encounter for insertion of intrauterine contraceptive device: Secondary | ICD-10-CM

## 2013-03-11 DIAGNOSIS — Z01812 Encounter for preprocedural laboratory examination: Secondary | ICD-10-CM

## 2013-03-11 LAB — POCT URINE PREGNANCY: Preg Test, Ur: NEGATIVE

## 2013-03-11 NOTE — Progress Notes (Signed)
   Subjective:    Patient ID: Natalie Marks, female    DOB: 1990/10/08, 22 y.o.   MRN: [id number]  HPI  22 yo P39 with a 7 week old infant here today for Paraguard insertion. She declines Mirena because she would like a hormone-free LARC. She will be starting a nursing program soon and wants to delay pregnancy for a few years.  Review of Systems     Objective:   Physical Exam  UPT negative, consent signed, Time out procedure done. Cervix prepped with betadine Paraguard was easily placed and the strings were cut to 3-4 cm. Uterus sounded to 9 cm. She tolerated the procedure well.       Assessment & Plan:   Contraception- Paraguard

## 2013-03-11 NOTE — Patient Instructions (Signed)
Intrauterine Device Insertion Care After Refer to this sheet in the next few weeks. These instructions provide you with information on caring for yourself after your procedure. Your caregiver may also give you more specific instructions. Your treatment has been planned according to current medical practices, but problems sometimes occur. Call your caregiver if you have any problems or questions after your procedure. HOME CARE INSTRUCTIONS   Only take over-the-counter or prescription medicines for pain, discomfort, or fever as directed by your caregiver. Do not use aspirin. This may increase bleeding.  Check your IUD to make sure it is in place before you resume sexual activity. You should be able to feel the strings. If you cannot feel the strings, something may be wrong. The IUD may have fallen out of the uterus, or the uterus may have been punctured (perforated) during placement. Also, if the strings are getting longer, it may mean that the IUD is being forced out of the uterus. You no longer have full protection from pregnancy if any of these problems occur.  You may resume sexual intercourse if you are not having problems with the IUD. The IUD is considered immediately effective.  You may resume normal activities.  Keep all follow-up appointments to be sure your IUD has remained in place. After the first exam, yearly exams are advised, unless you cannot feel the strings of your IUD.  Continue to check that the IUD is still in place by feeling for the strings after every menstrual period. SEEK MEDICAL CARE IF:   You have bleeding that is heavier or lasts longer than a normal menstrual cycle.  You have a fever.  You have increasing cramps or abdominal pain not relieved with medicine.  You have abdominal pain that does not seem to be related to the same area of earlier cramping and pain.  You are lightheaded, unusually weak, or faint.  You have abnormal vaginal discharge or  smells.  You have pain during sexual intercourse.  You cannot feel the IUD strings, or the IUD string has gotten longer.  You feel the IUD at the opening of the cervix in the vagina.  You think you are pregnant, or you miss your menstrual period.  The IUD string is hurting your sex partner. Document Released: 11/16/2010 Document Revised: 06/12/2011 Document Reviewed: 09/08/2012 ExitCare Patient Information 2014 [employer].  

## 2013-04-08 ENCOUNTER — Encounter: Payer: Self-pay | Admitting: Family Medicine

## 2013-04-08 ENCOUNTER — Ambulatory Visit (INDEPENDENT_AMBULATORY_CARE_PROVIDER_SITE_OTHER): Payer: Medicaid Other | Admitting: Family Medicine

## 2013-04-08 VITALS — BP 106/78 | HR 89 | Ht 62.0 in | Wt 122.0 lb

## 2013-04-08 DIAGNOSIS — Z30431 Encounter for routine checking of intrauterine contraceptive device: Secondary | ICD-10-CM

## 2013-04-08 DIAGNOSIS — Z23 Encounter for immunization: Secondary | ICD-10-CM

## 2013-04-08 NOTE — Patient Instructions (Signed)
Intrauterine Device Information An intrauterine device (IUD) is inserted into your uterus to prevent pregnancy. There are two types of IUDs available:   Copper IUD This type of IUD is wrapped in copper wire and is placed inside the uterus. Copper makes the uterus and fallopian tubes produce a fluid that kills sperm. The copper IUD can stay in place for 10 years.  Hormone IUD This type of IUD contains the hormone progestin (synthetic progesterone). The hormone thickens the cervical mucus and prevents sperm from entering the uterus. It also thins the uterine lining to prevent implantation of a fertilized egg. The hormone can weaken or kill the sperm that get into the uterus. One type of hormone IUD can stay in place for 5 years, and another type can stay in place for 3 years. Your health care provider will make sure you are a good candidate for a contraceptive IUD. Discuss with your health care provider the possible side effects.  ADVANTAGES OF AN INTRAUTERINE DEVICE  IUDs are highly effective, reversible, long acting, and low maintenance.   There are no estrogen-related side effects.   An IUD can be used when breastfeeding.   IUDs are not associated with weight gain.   The copper IUD works immediately after insertion.   The hormone IUD works right away if inserted within 7 days of your period starting. You will need to use a backup method of birth control for 7 days if the hormone IUD is inserted at any other time in your cycle.  The copper IUD does not interfere with your female hormones.   The hormone IUD can make heavy menstrual periods lighter and decrease cramping.   The hormone IUD can be used for 3 or 5 years.   The copper IUD can be used for 10 years. DISADVANTAGES OF AN INTRAUTERINE DEVICE  The hormone IUD can be associated with irregular bleeding patterns.   The copper IUD can make your menstrual flow heavier and more painful.   You may experience cramping and  vaginal bleeding after insertion.  Document Released: 02/22/2004 Document Revised: 11/20/2012 Document Reviewed: 09/08/2012 ExitCare Patient Information 2014 [employer].  

## 2013-04-09 NOTE — Progress Notes (Signed)
   Subjective:    Patient ID: Natalie Marks, female    DOB: 16-Apr-1990, 23 y.o.   MRN: [id number]  HPI  Pt. Had Paraguard IUD placed 12/9 and reports husband can feel strings, would like them trimmed.  Review of Systems  Constitutional: Negative for fever and chills.  Respiratory: Negative for shortness of breath.   Gastrointestinal: Negative for abdominal pain.       Objective:   Physical Exam  Vitals reviewed. Constitutional: She appears well-developed and well-nourished. No distress.  HENT:  Head: Normocephalic and atraumatic.  Neck: Neck supple.  Cardiovascular: Normal rate.   Pulmonary/Chest: Effort normal.  Abdominal: Soft. There is no tenderness.  Genitourinary: Vagina normal and uterus normal.    Strings are curled up behind cervix.  Very ends trimmed.  Strings tucked up around cervix.      Assessment & Plan:  Immunization due - Plan: Flu Vaccine QUAD 36+ mos IM  IUD check up

## 2014-02-02 ENCOUNTER — Encounter: Payer: Self-pay | Admitting: Family Medicine

## 2014-08-05 ENCOUNTER — Other Ambulatory Visit (HOSPITAL_COMMUNITY): Payer: Self-pay | Admitting: Nurse Practitioner

## 2014-08-05 DIAGNOSIS — G43909 Migraine, unspecified, not intractable, without status migrainosus: Secondary | ICD-10-CM

## 2014-08-12 ENCOUNTER — Ambulatory Visit (HOSPITAL_COMMUNITY)
Admission: RE | Admit: 2014-08-12 | Discharge: 2014-08-12 | Disposition: A | Discharge status: Home / Self Care | Payer: 59 | Source: Ambulatory Visit | Attending: Nurse Practitioner | Admitting: Nurse Practitioner

## 2014-08-12 DIAGNOSIS — G43909 Migraine, unspecified, not intractable, without status migrainosus: Secondary | ICD-10-CM | POA: Insufficient documentation

## 2014-12-14 LAB — OB RESULTS CONSOLE ANTIBODY SCREEN: ANTIBODY SCREEN: NEGATIVE

## 2014-12-14 LAB — OB RESULTS CONSOLE HIV ANTIBODY (ROUTINE TESTING): HIV: NONREACTIVE

## 2014-12-14 LAB — OB RESULTS CONSOLE ABO/RH: RH Type: POSITIVE

## 2014-12-14 LAB — OB RESULTS CONSOLE RUBELLA ANTIBODY, IGM: Rubella: IMMUNE

## 2014-12-14 LAB — OB RESULTS CONSOLE RPR: RPR: NONREACTIVE

## 2014-12-22 LAB — OB RESULTS CONSOLE GC/CHLAMYDIA
Chlamydia: NEGATIVE
GC PROBE AMP, GENITAL: NEGATIVE

## 2015-04-04 NOTE — L&D Delivery Note (Signed)
Delivery Note At 7:22 PM a viable female was delivered via Vaginal, Spontaneous Delivery (Presentation: Middle Occiput Anterior).  APGAR: 9, 9; weight-pending. I delivered the infant and transferred care to Dr. Ernestina PennaFogleman for delivery of the placenta.   [facility]ie Marks [facility] 07/08/2015, 7:47 PM    Delivery Note At 7:22 PM a viable female was delivered via Vaginal, Spontaneous Delivery (Presentation: Middle Occiput Anterior). Precipitous delivery  APGAR: 9, 9; weight pending .   Placenta status: Intact, Spontaneous.  Cord: 3 vessels with the following complications: None.  Cord pH: not indicated  Anesthesia: None  Episiotomy: None Lacerations: 2nd degree;Perineal;Sulcus- bilateral Suture Repair: 3.0 vicryl rapide Est. Blood Loss (mL):  350 cc  Mom to postpartum.  Baby to Couplet care / Skin to Skin.  Natalie Tonelli A. 07/08/2015, 8:01 PM

## 2015-04-16 DIAGNOSIS — Z36 Encounter for antenatal screening of mother: Secondary | ICD-10-CM | POA: Diagnosis not present

## 2015-04-16 DIAGNOSIS — Z3A27 27 weeks gestation of pregnancy: Secondary | ICD-10-CM | POA: Diagnosis not present

## 2015-04-16 DIAGNOSIS — O4442 Low lying placenta NOS or without hemorrhage, second trimester: Secondary | ICD-10-CM | POA: Diagnosis not present

## 2015-05-03 DIAGNOSIS — Z3A29 29 weeks gestation of pregnancy: Secondary | ICD-10-CM | POA: Diagnosis not present

## 2015-05-03 DIAGNOSIS — O99810 Abnormal glucose complicating pregnancy: Secondary | ICD-10-CM | POA: Diagnosis not present

## 2015-05-03 DIAGNOSIS — Z23 Encounter for immunization: Secondary | ICD-10-CM | POA: Diagnosis not present

## 2015-05-03 DIAGNOSIS — Z36 Encounter for antenatal screening of mother: Secondary | ICD-10-CM | POA: Diagnosis not present

## 2015-05-13 DIAGNOSIS — O368990 Maternal care for other specified fetal problems, unspecified trimester, not applicable or unspecified: Secondary | ICD-10-CM | POA: Diagnosis not present

## 2015-06-01 DIAGNOSIS — Z3A34 34 weeks gestation of pregnancy: Secondary | ICD-10-CM | POA: Diagnosis not present

## 2015-06-01 DIAGNOSIS — O99013 Anemia complicating pregnancy, third trimester: Secondary | ICD-10-CM | POA: Diagnosis not present

## 2015-06-02 MED FILL — FERRALET 90 DUAL-IRON TAB: 90-1 | 30 days supply | Qty: 30 | Fill #0

## 2015-06-15 DIAGNOSIS — O99810 Abnormal glucose complicating pregnancy: Secondary | ICD-10-CM | POA: Diagnosis not present

## 2015-06-15 DIAGNOSIS — Z3A36 36 weeks gestation of pregnancy: Secondary | ICD-10-CM | POA: Diagnosis not present

## 2015-06-15 DIAGNOSIS — O99013 Anemia complicating pregnancy, third trimester: Secondary | ICD-10-CM | POA: Diagnosis not present

## 2015-06-15 LAB — OB RESULTS CONSOLE GBS: STREP GROUP B AG: NEGATIVE

## 2015-06-15 MED FILL — ANUCORT-HC 25 MG SUPPOSITOR: 25 | 14 days supply | Qty: 28 | Fill #0

## 2015-06-24 DIAGNOSIS — Z3A37 37 weeks gestation of pregnancy: Secondary | ICD-10-CM | POA: Diagnosis not present

## 2015-06-24 DIAGNOSIS — O99013 Anemia complicating pregnancy, third trimester: Secondary | ICD-10-CM | POA: Diagnosis not present

## 2015-07-07 DIAGNOSIS — O99013 Anemia complicating pregnancy, third trimester: Secondary | ICD-10-CM | POA: Diagnosis not present

## 2015-07-07 DIAGNOSIS — Z3A39 39 weeks gestation of pregnancy: Secondary | ICD-10-CM | POA: Diagnosis not present

## 2015-07-08 ENCOUNTER — Other Ambulatory Visit: Payer: Self-pay | Admitting: Obstetrics

## 2015-07-08 ENCOUNTER — Encounter (HOSPITAL_COMMUNITY): Payer: Self-pay | Admitting: *Deleted

## 2015-07-08 ENCOUNTER — Inpatient Hospital Stay (HOSPITAL_COMMUNITY)
Admission: AD | Admit: 2015-07-08 | Discharge: 2015-07-10 | DRG: 775 | Disposition: A | Discharge status: Home / Self Care | Payer: 59 | Source: Ambulatory Visit | Attending: Obstetrics | Admitting: Obstetrics

## 2015-07-08 DIAGNOSIS — D649 Anemia, unspecified: Secondary | ICD-10-CM | POA: Diagnosis present

## 2015-07-08 DIAGNOSIS — Z3490 Encounter for supervision of normal pregnancy, unspecified, unspecified trimester: Secondary | ICD-10-CM

## 2015-07-08 DIAGNOSIS — Z3A39 39 weeks gestation of pregnancy: Secondary | ICD-10-CM

## 2015-07-08 DIAGNOSIS — O9902 Anemia complicating childbirth: Secondary | ICD-10-CM | POA: Diagnosis present

## 2015-07-08 HISTORY — DX: Anemia, unspecified: D64.9

## 2015-07-08 LAB — ABO/RH: ABO/RH(D): O POS

## 2015-07-08 LAB — CBC
HCT: 33 % — ABNORMAL LOW (ref 36.0–46.0)
HEMOGLOBIN: 10.6 g/dL — AB (ref 12.0–15.0)
MCH: 29 pg (ref 26.0–34.0)
MCHC: 32.1 g/dL (ref 30.0–36.0)
MCV: 90.2 fL (ref 78.0–100.0)
Platelets: 198 10*3/uL (ref 150–400)
RBC: 3.66 MIL/uL — AB (ref 3.87–5.11)
RDW: 17.9 % — ABNORMAL HIGH (ref 11.5–15.5)
WBC: 12.1 10*3/uL — ABNORMAL HIGH (ref 4.0–10.5)

## 2015-07-08 LAB — TYPE AND SCREEN
ABO/RH(D): O POS
Antibody Screen: NEGATIVE

## 2015-07-08 MED ORDER — DIPHENHYDRAMINE HCL 25 MG PO CAPS: 25.0000 mg | ORAL_CAPSULE | Freq: Four times a day (QID) | ORAL | Status: DC | PRN

## 2015-07-08 MED ORDER — WITCH HAZEL-GLYCERIN EX PADS
1.0000 "application " | MEDICATED_PAD | CUTANEOUS | Status: DC | PRN
  Administered 2015-07-08: 1 via TOPICAL

## 2015-07-08 MED ORDER — SIMETHICONE 80 MG PO CHEW
80.0000 mg | CHEWABLE_TABLET | ORAL | Status: DC | PRN
Start: 2015-07-08 — End: 2015-07-10

## 2015-07-08 MED ORDER — ACETAMINOPHEN 325 MG PO TABS: 650.0000 mg | ORAL_TABLET | ORAL | Status: DC | PRN

## 2015-07-08 MED ORDER — FLEET ENEMA 7-19 GM/118ML RE ENEM: 1.0000 | ENEMA | Freq: Every day | RECTAL | Status: DC | PRN

## 2015-07-08 MED ORDER — DIBUCAINE 1 % RE OINT
1.0000 "application " | TOPICAL_OINTMENT | RECTAL | Status: DC | PRN
  Administered 2015-07-08: 1 via RECTAL
  Filled 2015-07-08: qty 28

## 2015-07-08 MED ORDER — PHENYLEPHRINE 40 MCG/ML (10ML) SYRINGE FOR IV PUSH (FOR BLOOD PRESSURE SUPPORT)
80.0000 ug | PREFILLED_SYRINGE | INTRAVENOUS | Status: DC | PRN
  Filled 2015-07-08: qty 2

## 2015-07-08 MED ORDER — BENZOCAINE-MENTHOL 20-0.5 % EX AERO
1.0000 "application " | INHALATION_SPRAY | CUTANEOUS | Status: DC | PRN
  Administered 2015-07-08: 1 via TOPICAL
  Filled 2015-07-08: qty 56

## 2015-07-08 MED ORDER — BISACODYL 10 MG RE SUPP: 10.0000 mg | Freq: Every day | RECTAL | Status: DC | PRN

## 2015-07-08 MED ORDER — CITRIC ACID-SODIUM CITRATE 334-500 MG/5ML PO SOLN: 30.0000 mL | ORAL | Status: DC | PRN

## 2015-07-08 MED ORDER — PHENYLEPHRINE 40 MCG/ML (10ML) SYRINGE FOR IV PUSH (FOR BLOOD PRESSURE SUPPORT)
80.0000 ug | PREFILLED_SYRINGE | INTRAVENOUS | Status: DC | PRN
  Filled 2015-07-08: qty 20
  Filled 2015-07-08: qty 2

## 2015-07-08 MED ORDER — LACTATED RINGERS IV SOLN
1.0000 m[IU]/min | INTRAVENOUS | Status: DC
  Administered 2015-07-08: 2 m[IU]/min via INTRAVENOUS
  Filled 2015-07-08: qty 4

## 2015-07-08 MED ORDER — LIDOCAINE HCL (PF) 1 % IJ SOLN
30.0000 mL | INTRAMUSCULAR | Status: DC | PRN
  Administered 2015-07-08: 30 mL via SUBCUTANEOUS
  Filled 2015-07-08: qty 30

## 2015-07-08 MED ORDER — OXYTOCIN BOLUS FROM INFUSION
500.0000 mL | INTRAVENOUS | Status: DC
  Administered 2015-07-08: 500 mL via INTRAVENOUS

## 2015-07-08 MED ORDER — EPHEDRINE 5 MG/ML INJ
10.0000 mg | INTRAVENOUS | Status: DC | PRN
  Filled 2015-07-08: qty 2

## 2015-07-08 MED ORDER — OXYCODONE-ACETAMINOPHEN 5-325 MG PO TABS: 2.0000 | ORAL_TABLET | ORAL | Status: DC | PRN

## 2015-07-08 MED ORDER — TERBUTALINE SULFATE 1 MG/ML IJ SOLN
0.2500 mg | Freq: Once | INTRAMUSCULAR | Status: DC | PRN
  Filled 2015-07-08: qty 1

## 2015-07-08 MED ORDER — TETANUS-DIPHTH-ACELL PERTUSSIS 5-2.5-18.5 LF-MCG/0.5 IM SUSP: 0.5000 mL | Freq: Once | INTRAMUSCULAR | Status: DC

## 2015-07-08 MED ORDER — LACTATED RINGERS IV SOLN: 500.0000 mL | INTRAVENOUS | Status: DC | PRN

## 2015-07-08 MED ORDER — OXYCODONE-ACETAMINOPHEN 5-325 MG PO TABS: 1.0000 | ORAL_TABLET | ORAL | Status: DC | PRN

## 2015-07-08 MED ORDER — OXYTOCIN 10 UNIT/ML IJ SOLN
2.5000 [IU]/h | INTRAVENOUS | Status: DC
  Administered 2015-07-08: 39.96 [IU]/h via INTRAVENOUS

## 2015-07-08 MED ORDER — IBUPROFEN 600 MG PO TABS
600.0000 mg | ORAL_TABLET | Freq: Four times a day (QID) | ORAL | Status: DC
  Administered 2015-07-08 – 2015-07-10 (×8): 600 mg via ORAL
  Filled 2015-07-08 (×8): qty 1

## 2015-07-08 MED ORDER — ONDANSETRON HCL 4 MG/2ML IJ SOLN: 4.0000 mg | Freq: Four times a day (QID) | INTRAMUSCULAR | Status: DC | PRN

## 2015-07-08 MED ORDER — DIPHENHYDRAMINE HCL 50 MG/ML IJ SOLN: 12.5000 mg | INTRAMUSCULAR | Status: DC | PRN

## 2015-07-08 MED ORDER — ZOLPIDEM TARTRATE 5 MG PO TABS
5.0000 mg | ORAL_TABLET | Freq: Every evening | ORAL | Status: DC | PRN
Start: 2015-07-08 — End: 2015-07-10

## 2015-07-08 MED ORDER — LANOLIN HYDROUS EX OINT: TOPICAL_OINTMENT | CUTANEOUS | Status: DC | PRN

## 2015-07-08 MED ORDER — HYDROCORTISONE ACETATE 25 MG RE SUPP
25.0000 mg | Freq: Every day | RECTAL | Status: DC | PRN
  Filled 2015-07-08: qty 1

## 2015-07-08 MED ORDER — ONDANSETRON HCL 4 MG PO TABS
4.0000 mg | ORAL_TABLET | ORAL | Status: DC | PRN
Start: 2015-07-08 — End: 2015-07-10

## 2015-07-08 MED ORDER — PRENATAL MULTIVITAMIN CH
1.0000 | ORAL_TABLET | Freq: Every day | ORAL | Status: DC
  Administered 2015-07-09 – 2015-07-10 (×2): 1 via ORAL
  Filled 2015-07-08 (×2): qty 1

## 2015-07-08 MED ORDER — ONDANSETRON HCL 4 MG/2ML IJ SOLN: 4.0000 mg | INTRAMUSCULAR | Status: DC | PRN

## 2015-07-08 MED ORDER — LACTATED RINGERS IV SOLN
INTRAVENOUS | Status: DC
  Administered 2015-07-08: 17:00:00 via INTRAVENOUS

## 2015-07-08 MED ORDER — SENNOSIDES-DOCUSATE SODIUM 8.6-50 MG PO TABS
2.0000 | ORAL_TABLET | ORAL | Status: DC
  Administered 2015-07-08 – 2015-07-10 (×2): 2 via ORAL
  Filled 2015-07-08 (×2): qty 2

## 2015-07-08 MED ORDER — FENTANYL 2.5 MCG/ML BUPIVACAINE 1/10 % EPIDURAL INFUSION (WH - ANES)
14.0000 mL/h | INTRAMUSCULAR | Status: DC | PRN
  Filled 2015-07-08: qty 125

## 2015-07-08 MED ORDER — ACETAMINOPHEN 325 MG PO TABS
650.0000 mg | ORAL_TABLET | ORAL | Status: DC | PRN
Start: 2015-07-08 — End: 2015-07-10
  Administered 2015-07-09: 650 mg via ORAL
  Filled 2015-07-08: qty 2

## 2015-07-08 MED ORDER — LACTATED RINGERS IV SOLN: 500.0000 mL | Freq: Once | INTRAVENOUS | Status: DC

## 2015-07-08 NOTE — H&P (Signed)
Natalie FilaDanielle Marks is a 25 y.o. G2P1001 at 2579w1d presenting for ctx, mild all day. Pt notes onset contractions this am . Good fetal movement, No vaginal bleeding, not leaking fluid.  [facility] 9 wks - dated by LMP c/w 9 wk u/s - unplanned but desired - baby with stable pericardial effusion, needs echo PP - low lying placenta which resolved - anemia, on iron - h/o very low PP CBC (6.7) w/o h/o PPH- will watch closely   Prenatal Transfer Tool  Maternal Diabetes: No Genetic Screening: Normal Maternal Ultrasounds/Referrals: Abnormal:  Findings:   Other: Fetal Ultrasounds or other Referrals:  Fetal echo Maternal Substance Abuse:  No Significant Maternal Medications:  None Significant Maternal Lab Results: None     OB History    Gravida Para Term Preterm AB TAB SAB Ectopic Multiple Living   2 1 1  0 0 0 0 0 0 1     Past Medical History  Diagnosis Date  . Headache(784.0)   . Anemia    Past Surgical History  Procedure Laterality Date  . No past surgeries     Family History: family history is negative for Alcohol abuse, Arthritis, Asthma, Birth defects, Cancer, COPD, Diabetes, Depression, Drug abuse, Early death, Hearing loss, Heart disease, Hyperlipidemia, Hypertension, Kidney disease, Learning disabilities, Mental illness, Mental retardation, Miscarriages / Stillbirths, Stroke, and Vision loss. Social History:  reports that she has never smoked. She has never used smokeless tobacco. She reports that she does not drink alcohol or use illicit drugs.  Review of Systems - Negative except contractions   Dilation: 5 Effacement (%): 90 Station: -1 Exam by:: cwicker,rnc Blood pressure 117/67, pulse 85, temperature 97.9 F (36.6 C), temperature source Oral, resp. rate 18, height 5\' 2"  (1.575 m), weight 65.137 kg (143 lb 9.6 oz), last menstrual period 08/05/2014.  Repeat 6/50/-2. AROM- minimal return  Physical Exam:  Gen: well appearing, no distress Abd:  gravid, NT, no RUQ pain LE: no edema, equal bilaterally, non-tender Toco: irritability FH: baseline 120, accelerations present, no deceleratons, 10 beat variability  Prenatal labs: ABO, Rh: --/--/O POS (04/06 1630) Antibody: NEG (04/06 1630) Rubella: !Error! immune RPR: Nonreactive (09/12 0000)  HBsAg:   neg HIV: Non-reactive (09/12 0000)  GBS: Negative (03/14 0000)  1 hr Glucola 135, nl   Genetic screening nl quad Anatomy US normal  CBC    Component Value Date/Time   WBC 12.1* 07/08/2015 1630   RBC 3.66* 07/08/2015 1630   HGB 10.6* 07/08/2015 1630   HCT 33.0* 07/08/2015 1630   PLT 198 07/08/2015 1630   MCV 90.2 07/08/2015 1630   MCH 29.0 07/08/2015 1630   MCHC 32.1 07/08/2015 1630   RDW 17.9* 07/08/2015 1630   LYMPHSABS 2.0 01/18/2013 2159   MONOABS 0.6 01/18/2013 2159   EOSABS 0.2 01/18/2013 2159   BASOSABS 0.0 01/18/2013 2159       Assessment/Plan: 24 y.o. G2P1001 at 3279w1d Augmentation of labor at term GBS neg Watch for PPH and anemia Fetus with pericardial effusion, plan echo PP   Natalie Marks A. 07/08/2015, 6:05 PM

## 2015-07-08 NOTE — MAU Note (Signed)
Pt reports she has been having ctx on and off all morning. Reports some bloody show . +FM

## 2015-07-09 LAB — CBC
HCT: 28.5 % — ABNORMAL LOW (ref 36.0–46.0)
Hemoglobin: 9.2 g/dL — ABNORMAL LOW (ref 12.0–15.0)
MCH: 28.8 pg (ref 26.0–34.0)
MCHC: 32.3 g/dL (ref 30.0–36.0)
MCV: 89.3 fL (ref 78.0–100.0)
PLATELETS: 161 10*3/uL (ref 150–400)
RBC: 3.19 MIL/uL — ABNORMAL LOW (ref 3.87–5.11)
RDW: 18 % — AB (ref 11.5–15.5)
WBC: 13.8 10*3/uL — AB (ref 4.0–10.5)

## 2015-07-09 LAB — RPR: RPR Ser Ql: NONREACTIVE

## 2015-07-09 NOTE — Progress Notes (Signed)
PPD 1 SVD with 2nd degree repair  S:  Reports feeling well - tired             Tolerating po/ No nausea or vomiting             Bleeding is light             Pain controlled with motrin only             Up ad lib / ambulatory / voiding QS  Newborn breast feeding   O:               VS: BP 94/48 mmHg  Pulse 81  Temp(Src) 98.2 F (36.8 C) (Oral)  Resp 18  Ht 5\' 2"  (1.575 m)  Wt 65.137 kg (143 lb 9.6 oz)  BMI 26.26 kg/m2  LMP 08/05/2014 (Approximate)  Breastfeeding? Unknown   LABS:              Recent Labs  07/08/15 1630 07/09/15 0600  WBC 12.1* 13.8*  HGB 10.6* 9.2*  PLT 198 161               Blood type: --/--/O POS, O POS (04/06 1630)  Rubella: Immune (09/12 0000)                     Physical Exam:             Alert and oriented X3  Abdomen: soft, non-tender, non-distended              Fundus: firm, non-tender, U-1  Perineum: ice pack in place  Lochia: light  Extremities: no edema, no calf pain or tenderness   A: PPD # 1 SVD with 2nd degree repair   Doing well - stable status  P: Routine post partum orders    Marlinda MikeBAILEY, Natalie Marks CNM, MSN, [facility] 07/09/2015, 8:00 AM

## 2015-07-09 NOTE — Lactation Note (Signed)
This note was copied from a baby's chart. Lactation Consultation Note  Patient Name: Natalie Marks Natalie Marks: 07/09/2015 Reason for consult: Initial assessment Mom reports baby is latching well so far. Denies questions/concerns. Basic teaching reviewed with parents. Encouraged to continue to BF with feeding ques. Lactation brochure left for review, advised of OP services and support group. Encouraged to call for assist as desired.   Maternal Data Has patient been taught Hand Expression?: Yes Does the patient have breastfeeding experience prior to this delivery?: Yes  Feeding Feeding Type: Breast Fed Length of feed: 10 min  LATCH Score/Interventions                      Lactation Tools Discussed/Used WIC Program: No   Consult Status Consult Status: Follow-up Marks: 07/10/15 Follow-up type: In-patient    Alfred LevinsGranger, Alden Feagan Ann 07/09/2015, 2:49 PM

## 2015-07-09 NOTE — Progress Notes (Signed)
Post Partum Day 1 S/P spontaneous vaginal  Feeding: breast Subjective: No HA, SOB, CP, F/C, breast symptoms. Normal vaginal bleeding, no clots. Pain controlled.  ambulating without symptoms.  voiding without difficulty    Objective: BP 109/48 mmHg  Pulse 88  Temp(Src) 97.9 F (36.6 C) (Oral)  Resp 18  Ht 5\' 2"  (1.575 m)  Wt 65.137 kg (143 lb 9.6 oz)  BMI 26.26 kg/m2  SpO2 97%  LMP 08/05/2014 (Approximate)  Breastfeeding? Unknown I&O reviewed.   Physical Exam:  General: alert, cooperative and appears stated age Lochia: appropriate Uterine Fundus: firm DVT Evaluation: No evidence of DVT seen on physical exam. Ext: No c/c/e  Recent Labs  07/08/15 1630 07/09/15 0600  HGB 10.6* 9.2*  HCT 33.0* 28.5*      Assessment/Plan: 25 y.o.  PPD #1 .  normal postpartum exam Continue current postpartum care Ambulate Anemia, cont ferralet at d/c    LOS: 1 day   Natsha Guidry A. 07/09/2015 5:12 PM

## 2015-07-10 MED ORDER — IBUPROFEN 600 MG PO TABS: 600.0000 mg | ORAL_TABLET | Freq: Four times a day (QID) | ORAL | Status: DC

## 2015-07-10 NOTE — Discharge Summary (Signed)
Obstetric Discharge Summary  Reason for Admission: onset of labor Prenatal Procedures: none Intrapartum Procedures: spontaneous vaginal delivery Postpartum Procedures: none Complications-Operative and Postpartum: 2nd degree perineal laceration HEMOGLOBIN  Date Value Ref Range Status  07/09/2015 9.2* 12.0 - 15.0 g/dL Final   HCT  Date Value Ref Range Status  07/09/2015 28.5* 36.0 - 46.0 % Final    Physical Exam:  General: alert, cooperative and no distress Lochia: appropriate Uterine Fundus: firm Incision: healing well DVT Evaluation: No evidence of DVT seen on physical exam.  Discharge Diagnoses: Term Pregnancy-delivered  Discharge Information: Date: 07/10/2015 Activity: pelvic rest Diet: routine Medications: PNV and Ibuprofen Condition: stable Instructions: refer to practice specific booklet Discharge to: home Follow-up Information    Follow up with LAVOIE,MARIE-LYNE, MD. Schedule an appointment as soon as possible for a visit in 6 weeks.   Specialty:  Obstetrics and Gynecology   Contact information:   [address] [place] [id number] [phone]       Newborn Data: Live born female  Birth Weight: 8 lb 3.9 oz (3740 g) APGAR: 9, 9  Home with mother.  Marlinda MikeBAILEY, Lavaya Defreitas 07/10/2015, 12:20 PM

## 2015-07-10 NOTE — Progress Notes (Signed)
PPD 2 SVD  S:  Reports feeling well             Tolerating po/ No nausea or vomiting             Bleeding is light             Pain controlled withmotrin             Up ad lib / ambulatory / voiding QS  Newborn breast feeding  O:               VS: BP 96/51 mmHg  Pulse 72  Temp(Src) 98 F (36.7 C) (Oral)  Resp 18  Ht 5\' 2"  (1.575 m)  Wt 65.137 kg (143 lb 9.6 oz)  BMI 26.26 kg/m2  SpO2 97%  LMP 08/05/2014 (Approximate)  Breastfeeding? Unknown   LABS:              Recent Labs  07/08/15 1630 07/09/15 0600  WBC 12.1* 13.8*  HGB 10.6* 9.2*  PLT 198 161               Blood type: --/--/O POS, O POS (04/06 1630)  Rubella: Immune (09/12 0000)                           Physical Exam:             Alert and oriented X3  Abdomen: soft, non-tender, non-distended              Fundus: firm, non-tender, U-1  Extremities: noedema, no calf pain or tenderness    A: PPD # 2   Doing well - stable status  P: Routine post partum orders  DC home - WOB - Instructions reviewed  Marlinda MikeBAILEY, Rosalyn Archambault CNM, MSN, FACNM 07/10/2015, 12:17 PM

## 2015-07-10 NOTE — Lactation Note (Signed)
This note was copied from a baby's chart. Lactation Consultation Note  Patient Name: Boy Carver FilaDanielle Lindell ZOXWR'UToday's Date: 07/10/2015 Reason for consult: Follow-up assessment Visited with Mom and FOB on day of discharge, baby 39 hrs.  Mom easily latched baby using cross cradle hold.  Baby immediately became rhythmic, swallows observed.  Basics reviewed.  Encouraged skin to skin, and cue based feedings. Engorgement prevention and treatment discussed.  Mom has history of decreased milk supply at 3 months.  Encouraged one additional pumping per day after a couple weeks, to support her milk supply.  Mom returning to work at 11 weeks.  Reminded Mom of OP Lactation services available to her.  Encourager her to call prn.    Feeding Feeding Type: Breast Fed  LATCH Score/Interventions Latch: Grasps breast easily, tongue down, lips flanged, rhythmical sucking. Intervention(s): Breast compression;Adjust position  Audible Swallowing: Spontaneous and intermittent Intervention(s): Skin to skin;Alternate breast massage  Type of Nipple: Everted at rest and after stimulation  Comfort (Breast/Nipple): Soft / non-tender     Hold (Positioning): Assistance needed to correctly position infant at breast and maintain latch.  LATCH Score: 9  Lactation Tools Discussed/Used     Consult Status Consult Status: Complete Date: 07/10/15 Follow-up type: Call as needed    Judee ClaraSmith, Athony Coppa E 07/10/2015, 10:43 AM

## 2015-07-12 MED FILL — IBUPROFEN 600 MG TABLET: 600 | 7 days supply | Qty: 30 | Fill #0

## 2015-07-14 MED FILL — FERRALET 90 DUAL-IRON TAB: 90-1 | 30 days supply | Qty: 30 | Fill #1

## 2015-07-16 ENCOUNTER — Ambulatory Visit (HOSPITAL_COMMUNITY)
Admission: RE | Admit: 2015-07-16 | Discharge: 2015-07-16 | Disposition: A | Discharge status: Home / Self Care | Payer: 59 | Source: Ambulatory Visit | Attending: Obstetrics | Admitting: Obstetrics

## 2015-07-16 NOTE — Lactation Note (Signed)
Lactation Consult  Weight today: 7- 9.2 oz  3438 g Pediatrician concerned that baby is not gaining weight Mom reports he is very sleepy at the breast. Only nurses on one breast at a feeding. Is supplementing once a day with bottle of EBM at bedtime, Mom has history of low milk supply with first baby because she was anemic. Fredricka BonineConnor latched well but needed much stimulation to continue nursing, Nursed on both breasts and took 58 ml. Mom pleased he had done so well. Reviewed importance of keeping him awake with deep sucks and swallows on both breasts at every feeding. Mom pumping after every feeding obtaining about 45-60 ml Encouraged to continue pumping to promote milk supply. Discussed alternatives to bottle feeding. Mom wants to try feeding tube and syringe at the breast or can finger feed. Did not use it at this feeding since he had nursed on both breasts but reviewed setup use and cleaning of tool.Mom verbalizes understanding. To supplement if he only does one breast or she feels he did not empty breast well. To see Ped next Friday,. Suggested weight check on Monday or Tuesday. Mom states she will plan to come to BFSG on Monday night. No further questions at present. To call prn   Mother's reason for visit:  Ped office called for referral- baby 68 days old and not gaining Visit Type:  Feeding assessment  Consult:  Initial Lactation Consultant:  Pamelia HoitWeeks, Rayelle Armor D  ________________________________________________________________________   Joan FloresBaby's Name: Natalie Marks Date of Birth: 07/08/2013 Pediatrician: Chestine Sporelark Gender: female Gestational Age: 7677w1d (At Birth) Birth Weight: 8 lb 3.9 oz (3740 g) Weight at Discharge: Weight: 7 lb 13.2 oz (3550 g)Date of [facility]: [facility]   07/08/15 1922 07/09/15 2344  Weight: 8 lb 3.9 oz (3740 g) 7 lb 13.2 oz (3550 g)       ________________________________________________________________________  Mother's Name: Carver Filaanielle  Guirguis Type of delivery:  vag Breastfeeding Experience:  P2 Breast fed first baby for about 3 months but had problems with low milk supply. Had anemia and felt that was the cause of low milk supply  Maternal Medications:  Iron twice a day  ________________________________________________________________________  Breastfeeding History (Post Discharge)  Frequency of breastfeeding:  q 2-3 hours Duration of feeding:  10- 15 min then gets sleepy  Supplementation  Formula:  Volume 0 ml        Breastmilk:  Volume 30-60 ml Frequency:  Once per day Total volume per day:  30-60 ml  Method:  Bottle,   Pumping  Type of pump:  Medela pump in style Frequency:  After every feeding Volume:  45-60 ml  Infant Intake and Output Assessment  Voids:  5 in 24 hrs.  Color:  Clear yellow Stools:  8-10 in 24 hrs.  Color:  Green  ________________________________________________________________________  Maternal Breast Assessment  Breast:  Filling Nipple:  Erect  _______________________________________________________________________ Feeding Assessment/Evaluation  Initial feeding assessment:  Positioning:  Cross cradle Left breast  LATCH documentation:  Latch:  2 = Grasps breast easily, tongue down, lips flanged, rhythmical sucking.  Audible swallowing:  2 = Spontaneous and intermittent  Type of nipple:  2 = Everted at rest and after stimulation  Comfort (Breast/Nipple):  2 = Soft / non-tender  Hold (Positioning):  1 = Assistance needed to correctly position infant at breast and maintain latch  LATCH score:  9  Attached assessment:  Deep  Lips flanged:  Yes.    Lips untucked:  No.  Suck assessment:  Nutritive and  Nonnutritive-- needed stimulation to continue nursing   Pre-feed weight:  3438 g  7- 9.2 oz Post-feed weight:  3478 g  7- 10.7 oz Amount transferred:  40 ml Amount supplemented:  0 ml  Connor latched well, needed bottom lip untucked then mom reports no pain with  latch. Fredricka Bonine needed stimulation to continue nursing after about 10 min, Lots of swallows heard at beginning of feeding. Nipple round when he came off the breast.     Pre-feed weight:  3478 g  7- 10.7  Post-feed weight:  3496 g  7- 11.3 oz Amount transferred:  18 ml  Connor latched well to right breast. Again going off to sleep after a few minutes- needed stimulation to continue nursing.   Total amount transferred:  58 ml Total supplement given:  0 ml

## 2015-07-19 NOTE — Progress Notes (Addendum)
Natalie Marks came to the Monday evening breastfeeding support group (BFSG). Natalie Marks has only gained 1 oz since their lactation visit 3 days ago (on Friday, April 14th). He is 2511 days old and is 7% below BW. Based on what I could see during the BFSG, Natalie Marks could benefit from supplementing at the breast so as to increase his intake & to prevent him from falling asleep so often at the breast. Mom made an appt for Friday, the 21st to get an SNS.   Meanwhile, I encouraged Mom to supplement Natalie Marks after every feeding. She has only been supplementing about 8mL twice/day via finger-feeding (I also told Mom she could use a bottle if needed). Mom is pumping enough to increase his supplemental feeds.   Note: Natalie Marks transferred 68mL from the breast during the BFSG.  Natalie HewKim Edna Grover, RN, IBCLC

## 2015-07-23 ENCOUNTER — Ambulatory Visit (HOSPITAL_COMMUNITY)
Admission: RE | Admit: 2015-07-23 | Discharge: 2015-07-23 | Disposition: A | Discharge status: Home / Self Care | Payer: 59 | Source: Ambulatory Visit | Attending: Obstetrics | Admitting: Obstetrics

## 2015-07-23 NOTE — Lactation Note (Signed)
Lactation Consult  Mother's reason for visit: Follow up due to low weight gain Visit Type:  Feeding assesment  Appointment Notes:  Pt. Confirmed appt. For today  4 /21  Consult:  Follow-Up Lactation Consultant:  Kathrin GreathouseIorio, Pantelis Elgersma Ann  ________________________________________________________________________  Joan FloresBaby's Name: Natalie Marks Date of Birth: 07/08/2013 Pediatrician: Dr. Dahlia ByesElizabeth Tucker  Gender: female Gestational Age: 1153w1d (At Birth) Birth Weight: 8 lb 3.9 oz (3740 g) Weight at Discharge: Weight: 7 lb 13.2 oz (3550 g)Date of Dis[facility]: [facility]/2015 [facility]   07/08/15 1922 07/09/15 2344  Weight: 8 lb 3.9 oz (3740 g) 7 lb 13.2 oz (3550 g)   [facility] 7-[facility]  Location:Pediatrician's office Weight today: 3554 g , 7-13.3 oz    _______________________________________________________________________  Mother's Name: Natalie Marks Type of delivery:  Vaginal Delivery  Breastfeeding Experience:  2nd baby - per mom 1st baby only breast fed short time- slow weight gain.  Maternal Medical Conditions:  Per mom Anemia -  Maternal Medications:  PNV , Iron   ________________________________________________________________________  Breastfeeding History (Post Discharge)  Frequency of breastfeeding:  Every 2-3 hours  Duration of feeding:  20 -30 mins   Supplementing: per mom with EBM - 30 ml every feeding   Pumping: Medela Advanced - after every feeding , with 1 - 2 oz EBM Yield   Infant Intake and Output Assessment  Voids:  6-10  in 24 hrs.  Color:  Clear yellow Stools:  5-8  in 24 hrs.  Color:  Yellow  ________________________________________________________________________  Maternal Breast Assessment  Breast:  Filling Nipple:  Erect Pain level:  0 Pain interventions:  Expressed breast milk  _______________________________________________________________________ Feeding  Assessment/Evaluation  Initial feeding assessment:  Infant's oral assessment:  Variance - LC noted with oral exam with gloved finger - Baby has a short labial frenulum above the gum line , and upper lip stretches well with exam and when latched  High palate. Good mobility of tongue , raise is tongue above the corner of mouth and over gum line.   Positioning:  Cradle and Cross cradle Right breast  LATCH documentation:  Latch:  2 = Grasps breast easily, tongue down, lips flanged, rhythmical sucking.  Audible swallowing:  2 = Spontaneous and intermittent  Type of nipple:  2 = Everted at rest and after stimulation  Comfort (Breast/Nipple):  1 = Filling, red/small blisters or bruises, mild/mod discomfort  Hold (Positioning):  1 = Assistance needed to correctly position infant at breast and maintain latch  LATCH score: 8   Attached assessment:  Shallow at 1 st , adjusted and depth achieved   Lips flanged:  Yes.    Lips untucked:  Yes.    Suck assessment:  Nutritive and Nonnutritive  Tools:  None  Instructed on use and cleaning of tool:  No.  Pre-feed weight:  3554  g  , 7-13.3 oz  Post-feed weight:  3596 g , 7-14.8 oz  Amount transferred: 42 ml  Amount supplemented: none   Additional Feeding Assessment -   Infant's oral assessment:  Variance - see above note   Positioning:  Football Left breast  LATCH documentation:  Latch:  2 = Grasps breast easily, tongue down, lips flanged, rhythmical sucking.  Audible swallowing:  2 = Spontaneous and intermittent  Type of nipple:  2 = Everted at rest and after stimulation  Comfort (Breast/Nipple):  1 = Filling, red/small blisters or bruises, mild/mod discomfort  Hold (Positioning):  1 = Assistance needed  to correctly position infant at breast and maintain latch  LATCH score:  8   Attached assessment:  Shallow @ 1st , depth achieved with breast compressions   Lips flanged:  No - flipped to flange position   Lips untucked:  Yes.     Suck assessment:  Nutritive and Nonnutritive  Tools:  None  Instructed on use and cleaning of tool:   Stool diaper change and re- weight  Pre-feed weight:  3590 g , 7-14.7 oz  Post-feed weight: 3620 g , 7-15.7 oz Amount transferred:  30 ml  Amount supplemented: 30 ml    Total amount pumped post feed:  Did not post pump   Total amount transferred:  72 ml  Total supplement given: 30 ml ( EBM from home )   Lactation Impression:  Mom is working hard on breast feeding and extra pumping due to low weight gain.  Baby awake and hungry - per mom last fed at home at 2 pm at the breast 35 mins  @ this consult mom latched baby using the cradle position . LC had her switch to cross cradle to obtain depth and then cradle  Baby fed 15 mins , released and LC assisted mom to re- latch in the football position. Latched with depth . Transferred off 42 ml  Baby still hungry , diaper changed, re-weight , latched in football position. Noted rolling upper lip under , flipped to flanged position ,  Active multiply swallowing pattern noted, increased with breast compressions. Per mom comfortable. Baby transferred 30 ml off 2nd breast.  Nipple appeared well rounded when baby released.  LC noted during the feeding baby intermittently being non - nutritive. Mom easily was able to get him back into a nutritive pattern with breast compressions  Or stimulation. [facility] feels baby has got'en into this habit ) Reminded mom not top allow behavior.  Lactation Plan of Care:  Praised mom for all her efforts breast feeding and pumping to increase her milk supply. Breast feeding Goals: Continue to increase baby's weight  Protect establishing milk supply  Watch for non - nutritive and hanging out feeding behaviors at breast  Skin to skin feedings until the baby weight is up to birth weight and gaining steadily  Option # 1 - feed Connor at both breast for 15 -20 mins ( each ) and supplement after wards 30 ml,  Option #2 -  Feed 1st breast 15 - 20 mins , supplement with a bottle 45 ml , psot pump both breast  Extra pumping_ is indicated to increase milk supply  After am feeding , mid day , early after noon or when you choose when the breast are the fullest  Post pump aft least after 4-5 feedings a day - save milk and feed back to baby

## 2015-08-25 DIAGNOSIS — O9122 Nonpurulent mastitis associated with the puerperium: Secondary | ICD-10-CM | POA: Diagnosis not present

## 2015-09-03 DIAGNOSIS — Z30430 Encounter for insertion of intrauterine contraceptive device: Secondary | ICD-10-CM | POA: Diagnosis not present

## 2015-09-03 DIAGNOSIS — Z3202 Encounter for pregnancy test, result negative: Secondary | ICD-10-CM | POA: Diagnosis not present

## 2016-05-29 DIAGNOSIS — N61 Mastitis without abscess: Secondary | ICD-10-CM | POA: Diagnosis not present

## 2016-08-22 DIAGNOSIS — Z01419 Encounter for gynecological examination (general) (routine) without abnormal findings: Secondary | ICD-10-CM | POA: Diagnosis not present

## 2016-08-22 DIAGNOSIS — Z6820 Body mass index (BMI) 20.0-20.9, adult: Secondary | ICD-10-CM | POA: Diagnosis not present

## 2016-08-22 DIAGNOSIS — Z113 Encounter for screening for infections with a predominantly sexual mode of transmission: Secondary | ICD-10-CM | POA: Diagnosis not present

## 2016-09-27 DIAGNOSIS — H52223 Regular astigmatism, bilateral: Secondary | ICD-10-CM | POA: Diagnosis not present

## 2016-09-27 DIAGNOSIS — H5203 Hypermetropia, bilateral: Secondary | ICD-10-CM | POA: Diagnosis not present

## 2017-05-04 ENCOUNTER — Encounter: Payer: Self-pay | Admitting: Nurse Practitioner

## 2017-05-04 ENCOUNTER — Ambulatory Visit: Payer: Medicaid Other | Admitting: Nurse Practitioner

## 2017-05-04 VITALS — BP 90/64 | HR 83 | Temp 97.8°F | Wt 115.6 lb

## 2017-05-04 DIAGNOSIS — J069 Acute upper respiratory infection, unspecified: Secondary | ICD-10-CM

## 2017-05-04 MED ORDER — FLUTICASONE PROPIONATE 50 MCG/ACT NA SUSP: 2.0000 | Freq: Every day | NASAL | 0 refills | Status: DC

## 2017-05-04 NOTE — Patient Instructions (Addendum)
Upper Respiratory Infection, Adult Most upper respiratory infections (URIs) are a viral infection of the air passages leading to the lungs. A URI affects the nose, throat, and upper air passages. The most common type of URI is nasopharyngitis and is typically referred to as "the common cold." URIs run their course and usually go away on their own. Most of the time, a URI does not require medical attention, but sometimes a bacterial infection in the upper airways can follow a viral infection. This is called a secondary infection. Sinus and middle ear infections are common types of secondary upper respiratory infections. Bacterial pneumonia can also complicate a URI. A URI can worsen asthma and chronic obstructive pulmonary disease (COPD). Sometimes, these complications can require emergency medical care and may be life threatening. What are the causes? Almost all URIs are caused by viruses. A virus is a type of germ and can spread from one person to another. What increases the risk? You may be at risk for a URI if:  You smoke.  You have chronic heart or lung disease.  You have a weakened defense (immune) system.  You are very young or very old.  You have nasal allergies or asthma.  You work in crowded or poorly ventilated areas.  You work in health care facilities or schools.  What are the signs or symptoms? Symptoms typically develop 2-3 days after you come in contact with a cold virus. Most viral URIs last 7-10 days. However, viral URIs from the influenza virus (flu virus) can last 14-18 days and are typically more severe. Symptoms may include:  Runny or stuffy (congested) nose.  Sneezing.  Cough.  Sore throat.  Headache.  Fatigue.  Fever.  Loss of appetite.  Pain in your forehead, behind your eyes, and over your cheekbones (sinus pain).  Muscle aches.  How is this diagnosed? Your health care provider may diagnose a URI by:  Physical exam.  Tests to check that your  symptoms are not due to another condition such as: ? Strep throat. ? Sinusitis. ? Pneumonia. ? Asthma.  How is this treated? A URI goes away on its own with time. It cannot be cured with medicines, but medicines may be prescribed or recommended to relieve symptoms. Medicines may help:  Reduce your fever.  Reduce your cough.  Relieve nasal congestion.  Follow these instructions at home:  Take medicines only as directed by your health care provider.  Gargle warm saltwater or take cough drops to comfort your throat as directed by your health care provider.  Use a warm mist humidifier or inhale steam from a shower to increase air moisture. This may make it easier to breathe.  Drink enough fluid to keep your urine clear or pale yellow.  Eat soups and other clear broths and maintain good nutrition.  Rest as needed.  Return to work when your temperature has returned to normal or as your health care provider advises. You may need to stay home longer to avoid infecting others. You can also use a face mask and careful hand washing to prevent spread of the virus.  Increase the usage of your inhaler if you have asthma.  Do not use any tobacco products, including cigarettes, chewing tobacco, or electronic cigarettes. If you need help quitting, ask your health care provider. How is this prevented? The best way to protect yourself from getting a cold is to practice good hygiene.  Avoid oral or hand contact with people with cold symptoms.  Wash your   hands often if contact occurs.  There is no clear evidence that vitamin C, vitamin E, echinacea, or exercise reduces the chance of developing a cold. However, it is always recommended to get plenty of rest, exercise, and practice good nutrition. Contact a health care provider if:  You are getting worse rather than better.  Your symptoms are not controlled by medicine.  You have chills.  You have worsening shortness of breath.  You have  brown or red mucus.  You have yellow or brown nasal discharge.  You have pain in your face, especially when you bend forward.  You have a fever.  You have swollen neck glands.  You have pain while swallowing.  You have white areas in the back of your throat. Get help right away if:  You have severe or persistent: ? Headache. ? Ear pain. ? Sinus pain. ? Chest pain.  You have chronic lung disease and any of the following: ? Wheezing. ? Prolonged cough. ? Coughing up blood. ? A change in your usual mucus.  You have a stiff neck.  You have changes in your: ? Vision. ? Hearing. ? Thinking. ? Mood. This information is not intended to replace advice given to you by your health care provider. Make sure you discuss any questions you have with your health care provider. Document Released: 09/13/2000 Document Revised: 11/21/2015 Document Reviewed: 06/25/2013 Elsevier Interactive Patient Education  2018 [employer] Inc[employer]  

## 2017-05-04 NOTE — Progress Notes (Signed)
Subjective:  Natalie Marks is a 27 y.o. female who presents for evaluation of possible sinusitis.  Symptoms include achiness, no fever, post nasal drip, sneezing and sore throat.  Onset of symptoms was 1 day ago, and has been unchanged since that time.  Treatment to date:  Excedrin.  High risk factors for influenza complications:  none.  The following portions of the patient's history were reviewed and updated as appropriate:  allergies, current medications and past medical history.  Constitutional: positive for fatigue, negative for anorexia, chills and fevers Eyes: negative Ears, nose, mouth, throat, and face: positive for scratchy throat, negative for earaches, hoarseness and nasal congestion Respiratory: positive for cough Cardiovascular: negative Gastrointestinal: negative Neurological: positive for headaches Behavioral/Psych: negative Allergic/Immunologic: negative Objective:  BP 90/64   Pulse 83   Temp 97.8 F (36.6 C)   Wt 115 lb 9.6 oz (52.4 kg)   SpO2 99%   BMI 21.14 kg/m  General appearance: alert, cooperative and no distress Head: Normocephalic, without obvious abnormality, atraumatic Eyes: conjunctivae/corneas clear. PERRL, EOM's intact. Fundi benign. Ears: normal TM's and external ear canals both ears Nose: turbinates swollen, inflamed, sinus tenderness right Throat: abnormal findings: mild oropharyngeal erythema Lungs: clear to auscultation bilaterally Heart: regular rate and rhythm, S1, S2 normal, no murmur, click, rub or gallop Abdomen: soft, non-tender; bowel sounds normal; no masses,  no organomegaly Pulses: 2+ and symmetric Skin: Skin color, texture, turgor normal. No rashes or lesions Lymph nodes: cervical and submandibular nodes normal Neurologic: Grossly normal    Assessment:  viral upper respiratory illness    Plan:  Discussed diagnosis and treatment of URI. Discussed the importance of avoiding unnecessary antibiotic therapy. Educational material  distributed and questions answered. Suggested symptomatic OTC remedies. Supportive care with appropriate antipyretics and fluids. Nasal steroids per orders. Follow up as needed.

## 2017-05-06 ENCOUNTER — Telehealth: Payer: Self-pay | Admitting: Nurse Practitioner

## 2017-05-06 NOTE — Telephone Encounter (Signed)
Called patient to check her status.  Spoke with patient and she informed that she is not any worse, but a little better by using the nasal spray.  She stated it really helps.  Informed patient to call if her symptoms persist by 2/8.

## 2017-05-09 ENCOUNTER — Telehealth: Payer: 59 | Admitting: Family

## 2017-05-09 ENCOUNTER — Telehealth: Payer: Self-pay

## 2017-05-09 DIAGNOSIS — R05 Cough: Secondary | ICD-10-CM

## 2017-05-09 DIAGNOSIS — R0602 Shortness of breath: Secondary | ICD-10-CM

## 2017-05-09 DIAGNOSIS — R059 Cough, unspecified: Secondary | ICD-10-CM

## 2017-05-09 DIAGNOSIS — R0789 Other chest pain: Secondary | ICD-10-CM

## 2017-05-09 NOTE — Telephone Encounter (Signed)
Patient called, because she states she started feeling bad again since yesterday (05/08/2017), she tried Mucinex and she called us to see what can she do, we gave her the option to use the evisit program or come here and see un for a follow up.

## 2017-05-09 NOTE — Progress Notes (Signed)
Based on what you shared with me it looks like you have a serious condition that should be evaluated in a face to face office visit.  NOTE: If you entered your credit card information for this eVisit, you will not be charged. You may see a "hold" on your card for the $30 but that hold will drop off and you will not have a charge processed.  If you are having a true medical emergency please call 911.  If you need an urgent face to face visit, [facility] has four urgent care centers for your convenience.  If you need care fast and have a high deductible or no insurance consider:   [email or web address] to reserve your spot online an avoid wait times  [facility] [address], [address] [place], [place] [address] 8 am to 8 pm Monday-Friday 10 am to 4 pm Saturday-Sunday *Across the street from [employer]  [facility]  [address] [place], [address] 8 am to 5 pm Monday-Friday * In the [facility] the [facility]   The following sites will take your  insurance:  . [facility]  [phone] Get Driving Directions Find a Provider at this Location  [address] [place], [place] [address] . 10 am to 8 pm Monday-Friday . 12 pm to 8 pm Saturday-Sunday   . [facility]  [phone] Get Driving Directions Find a Provider at this Location  [address], [address] [place], [place] [address] . 8 am to 8 pm Monday-Friday . 9 am to 6 pm Saturday . 11 am to 6 pm Sunday   . [facility] [place]  [phone] Get Driving Directions  [address] [place], [place] [address] . 8 am to 8 pm Monday-Friday . 8 am to 4 pm Saturday-Sunday   Your e-visit answers were reviewed by a board certified advanced clinical practitioner to complete your personal care plan.  Thank you for using e-Visits.  

## 2017-05-10 ENCOUNTER — Encounter: Payer: Self-pay | Admitting: Internal Medicine

## 2017-05-10 ENCOUNTER — Ambulatory Visit (INDEPENDENT_AMBULATORY_CARE_PROVIDER_SITE_OTHER): Payer: 59 | Admitting: Internal Medicine

## 2017-05-10 DIAGNOSIS — J208 Acute bronchitis due to other specified organisms: Secondary | ICD-10-CM

## 2017-05-10 DIAGNOSIS — J209 Acute bronchitis, unspecified: Secondary | ICD-10-CM

## 2017-05-10 MED ORDER — AZITHROMYCIN 250 MG PO TABS: ORAL_TABLET | ORAL | 0 refills | Status: DC

## 2017-05-10 MED ORDER — BENZONATATE 200 MG PO CAPS: 200.0000 mg | ORAL_CAPSULE | Freq: Three times a day (TID) | ORAL | 1 refills | Status: DC | PRN

## 2017-05-10 MED FILL — AZITHROMYCIN 250 MG TAB: 250 | 5 days supply | Qty: 6 | Fill #0

## 2017-05-10 MED FILL — BENZONATATE 200 MG CAP: 200 | 10 days supply | Qty: 30 | Fill #0

## 2017-05-10 NOTE — Progress Notes (Signed)
05/10/17-27 year old female never smoker- front office staff Pulmonary Consult: Pt continues to have cough-unable to get mucus up, drainage,sore fhroat from cough and first thing in the morning, Acute upper respiratory infection times 7 days.  Urgent care treated with Flonase.  Now moving into chest with scant green chunks coughed up only occasionally.  No fever.  Ears okay.  Husband and 2 children with similar syndrome now.  Denies pregnant.  No history asthma.  Prior to Admission medications   Medication Sig Start Date End Date Taking? Authorizing Provider  azithromycin (ZITHROMAX) 250 MG tablet 2 today then one daily 05/10/17   Jetty DuhamelYoung, Jersie Beel D, MD  benzonatate (TESSALON) 200 MG capsule Take 1 capsule (200 mg total) by mouth 3 (three) times daily as needed for cough. 05/10/17   Waymon BudgeYoung, Demya Scruggs D, MD   Past Medical History:  Diagnosis Date  . Anemia   . Headache(784.0)   . Postpartum care following vaginal delivery (4/6) 07/08/2015   Past Surgical History:  Procedure Laterality Date  . NO PAST SURGERIES     Family History  Problem Relation Age of Onset  . Diabetes Maternal Grandmother   . Alcohol abuse Neg Hx   . Arthritis Neg Hx   . Asthma Neg Hx   . Birth defects Neg Hx   . Cancer Neg Hx   . COPD Neg Hx   . Depression Neg Hx   . Drug abuse Neg Hx   . Early death Neg Hx   . Hearing loss Neg Hx   . Heart disease Neg Hx   . Hyperlipidemia Neg Hx   . Hypertension Neg Hx   . Kidney disease Neg Hx   . Learning disabilities Neg Hx   . Mental illness Neg Hx   . Mental retardation Neg Hx   . Miscarriages / Stillbirths Neg Hx   . Stroke Neg Hx   . Vision loss Neg Hx    Social History   Socioeconomic History  . Marital status: Married    Spouse name: Jorja Loaim  . Number of children: 2  . Years of education: Not on file  . Highest education level: Not on file  Social Needs  . Financial resource strain: Not on file  . Food insecurity - worry: Not on file  . Food insecurity - inability:  Not on file  . Transportation needs - medical: Not on file  . Transportation needs - non-medical: Not on file  Occupational History  . Not on file  Tobacco Use  . Smoking status: Never Smoker  . Smokeless tobacco: Never Used  Substance and Sexual Activity  . Alcohol use: No  . Drug use: No  . Sexual activity: Not Currently    Partners: Male    Birth control/protection: None  Other Topics Concern  . Not on file  Social History Narrative  . Not on file   ROS-see HPI   + = positive Constitutional:    weight loss, night sweats, fevers, chills, fatigue, lassitude. HEENT:    headaches, difficulty swallowing, tooth/dental problems, sore throat,       sneezing, itching, ear ache, nasal congestion, post nasal drip, snoring CV:    chest pain, orthopnea, PND, swelling in lower extremities, anasarca,  dizziness, palpitations Resp:   shortness of breath with exertion or at rest.                +productive cough,   non-productive cough, coughing up of blood.              change in color of mucus.  wheezing.   Skin:    rash or lesions. GI:  No-   heartburn, indigestion, abdominal pain, nausea, vomiting, diarrhea,                 change in bowel habits, loss of appetite GU: dysuria, change in color of urine, no urgency or frequency.   flank pain. MS:   joint pain, stiffness, decreased range of motion, back pain. Neuro-     nothing unusual Psych:  change in mood or affect.  depression or anxiety.   memory loss.  OBJ- Physical Exam General- Alert, Oriented, Affect-appropriate, Distress- none acute Skin- rash-none, lesions- none, excoriation- none Lymphadenopathy- none Head- atraumatic            Eyes- Gross vision intact, PERRLA, conjunctivae and secretions clear            Ears- Hearing, canals-normal            Nose- Clear, no-Septal dev, mucus, polyps, erosion, perforation             Throat- Mallampati II , mucosa clear , drainage-  none, tonsils- atrophic Neck- flexible , trachea midline, no stridor , thyroid nl, carotid no bruit Chest - symmetrical excursion , unlabored           Heart/CV- RRR , no murmur , no gallop  , no rub, nl s1 s2                           - JVD- none , edema- none, stasis changes- none, varices- none           Lung- clear to P&A, wheeze- none, cough- none , dullness-none, rub- none           Chest wall-  Abd-  Br/ Gen/ Rectal- Not done, not indicated Extrem- cyanosis- none, clubbing, none, atrophy- none, strength- nl Neuro- grossly intact to observation

## 2017-05-10 NOTE — Patient Instructions (Signed)
Script Z pak sent   Script benzonatate perles for cough sent  Stay well hydrated. Ok to take the Mucinex if it seem  to help

## 2017-05-10 NOTE — Assessment & Plan Note (Signed)
URI with acute bronchitis.  Viral syndrome to begin with but lingering and now cough more productive green suggesting transition to bacterial infection. Plan-hydration, Z-Pak, benzonatate, throat lozenges

## 2017-05-25 DIAGNOSIS — G43009 Migraine without aura, not intractable, without status migrainosus: Secondary | ICD-10-CM | POA: Diagnosis not present

## 2017-05-25 MED FILL — RIZATRIPTAN BENZOATE 10 MG: 10 | 30 days supply | Qty: 12 | Fill #0

## 2017-06-07 MED FILL — EMGALITY 120 MG/ML SOAJ: 120 | 29 days supply | Qty: 1 | Fill #0

## 2017-06-19 DIAGNOSIS — R109 Unspecified abdominal pain: Secondary | ICD-10-CM | POA: Diagnosis not present

## 2017-06-19 DIAGNOSIS — Z30432 Encounter for removal of intrauterine contraceptive device: Secondary | ICD-10-CM | POA: Diagnosis not present

## 2017-06-27 DIAGNOSIS — R55 Syncope and collapse: Secondary | ICD-10-CM | POA: Diagnosis not present

## 2017-06-27 DIAGNOSIS — N3001 Acute cystitis with hematuria: Secondary | ICD-10-CM | POA: Diagnosis not present

## 2017-06-27 DIAGNOSIS — N3000 Acute cystitis without hematuria: Secondary | ICD-10-CM | POA: Diagnosis not present

## 2017-06-28 ENCOUNTER — Other Ambulatory Visit: Payer: Self-pay | Admitting: Internal Medicine

## 2017-06-28 ENCOUNTER — Other Ambulatory Visit (INDEPENDENT_AMBULATORY_CARE_PROVIDER_SITE_OTHER): Payer: 59

## 2017-06-28 DIAGNOSIS — R55 Syncope and collapse: Secondary | ICD-10-CM

## 2017-06-28 LAB — COMPREHENSIVE METABOLIC PANEL WITH GFR
ALT: 10 U/L (ref 0–35)
AST: 14 U/L (ref 0–37)
Albumin: 4.2 g/dL (ref 3.5–5.2)
Alkaline Phosphatase: 43 U/L (ref 39–117)
BUN: 9 mg/dL (ref 6–23)
CO2: 26 meq/L (ref 19–32)
Calcium: 9.1 mg/dL (ref 8.4–10.5)
Chloride: 104 meq/L (ref 96–112)
Creatinine, Ser: 0.68 mg/dL (ref 0.40–1.20)
GFR: 112.67 mL/min
Glucose, Bld: 111 mg/dL — ABNORMAL HIGH (ref 70–99)
Potassium: 3.6 meq/L (ref 3.5–5.1)
Sodium: 138 meq/L (ref 135–145)
Total Bilirubin: 0.3 mg/dL (ref 0.2–1.2)
Total Protein: 7.8 g/dL (ref 6.0–8.3)

## 2017-06-28 LAB — CBC WITH DIFFERENTIAL/PLATELET
Basophils Absolute: 0 10*3/uL (ref 0.0–0.1)
Basophils Relative: 0.6 % (ref 0.0–3.0)
Eosinophils Absolute: 0.1 10*3/uL (ref 0.0–0.7)
Eosinophils Relative: 1.7 % (ref 0.0–5.0)
HCT: 38.2 % (ref 36.0–46.0)
Hemoglobin: 13 g/dL (ref 12.0–15.0)
Lymphocytes Relative: 26.7 % (ref 12.0–46.0)
Lymphs Abs: 1.8 10*3/uL (ref 0.7–4.0)
MCHC: 33.9 g/dL (ref 30.0–36.0)
MCV: 90.4 fl (ref 78.0–100.0)
Monocytes Absolute: 0.5 10*3/uL (ref 0.1–1.0)
Monocytes Relative: 7.9 % (ref 3.0–12.0)
Neutro Abs: 4.3 10*3/uL (ref 1.4–7.7)
Neutrophils Relative %: 63.1 % (ref 43.0–77.0)
Platelets: 246 10*3/uL (ref 150.0–400.0)
RBC: 4.22 Mil/uL (ref 3.87–5.11)
RDW: 13 % (ref 11.5–15.5)
WBC: 6.8 10*3/uL (ref 4.0–10.5)

## 2017-06-28 LAB — TSH: TSH: 2.76 u[IU]/mL (ref 0.35–4.50)

## 2017-06-28 NOTE — Progress Notes (Signed)
PCP requested lab work on patient-CY agreed to have labs drawn under his name and then faxed to PCP. [facility].

## 2017-07-10 MED FILL — NUVARING VAGINAL RING: 0.12-0.015 | 84 days supply | Qty: 3 | Fill #0

## 2017-07-11 MED FILL — EMGALITY 120 MG/ML SOAJ: 120 | 29 days supply | Qty: 1 | Fill #1

## 2017-07-30 DIAGNOSIS — R109 Unspecified abdominal pain: Secondary | ICD-10-CM | POA: Diagnosis not present

## 2017-07-30 DIAGNOSIS — Z309 Encounter for contraceptive management, unspecified: Secondary | ICD-10-CM | POA: Diagnosis not present

## 2017-08-02 MED FILL — NORETHINDRONE 0.35 MG TAB: 0.35 | 28 days supply | Qty: 28 | Fill #0

## 2017-08-20 MED FILL — EMGALITY 120 MG/ML SOAJ: 120 | 29 days supply | Qty: 1 | Fill #2

## 2017-08-22 DIAGNOSIS — Z01419 Encounter for gynecological examination (general) (routine) without abnormal findings: Secondary | ICD-10-CM | POA: Diagnosis not present

## 2017-08-22 DIAGNOSIS — Z6823 Body mass index (BMI) 23.0-23.9, adult: Secondary | ICD-10-CM | POA: Diagnosis not present

## 2017-08-22 DIAGNOSIS — Z118 Encounter for screening for other infectious and parasitic diseases: Secondary | ICD-10-CM | POA: Diagnosis not present

## 2017-08-22 MED FILL — NORETHINDRONE 0.35 MG TAB: 0.35 | 84 days supply | Qty: 84 | Fill #0

## 2017-09-14 DIAGNOSIS — F411 Generalized anxiety disorder: Secondary | ICD-10-CM | POA: Diagnosis not present

## 2017-09-14 MED FILL — clonazePAM 0.5 MG TABS: 0.5 | 30 days supply | Qty: 30 | Fill #0

## 2017-09-14 MED FILL — CITALOPRAM HBR 20 MG TABLET: 20 | 30 days supply | Qty: 30 | Fill #0

## 2017-09-20 MED FILL — EMGALITY 120 MG/ML SOAJ: 120 | 29 days supply | Qty: 1 | Fill #3

## 2017-10-15 ENCOUNTER — Telehealth: Payer: 59 | Admitting: Family

## 2017-10-15 DIAGNOSIS — B9689 Other specified bacterial agents as the cause of diseases classified elsewhere: Secondary | ICD-10-CM | POA: Diagnosis not present

## 2017-10-15 DIAGNOSIS — J019 Acute sinusitis, unspecified: Secondary | ICD-10-CM

## 2017-10-15 MED ORDER — AMOXICILLIN-POT CLAVULANATE 875-125 MG PO TABS: 1.0000 | ORAL_TABLET | Freq: Two times a day (BID) | ORAL | 0 refills | Status: DC

## 2017-10-15 MED ORDER — FLUTICASONE PROPIONATE 50 MCG/ACT NA SUSP: 2.0000 | Freq: Every day | NASAL | 0 refills | Status: DC

## 2017-10-15 MED FILL — FLUTICASONE PROP 50 MCG SPR: 50 | 30 days supply | Qty: 16 | Fill #0

## 2017-10-15 MED FILL — AMOX-CLAV 875-125 MG TABLET: 875-125 | 7 days supply | Qty: 14 | Fill #0

## 2017-10-15 NOTE — Progress Notes (Signed)
We are sorry that you are not feeling well.  Here is how we plan to help!  Based on what you have shared with me it looks like you have sinusitis.  Sinusitis is inflammation and infection in the sinus cavities of the head.  Based on your presentation I believe you most likely have Acute Bacterial Sinusitis.  This is an infection caused by bacteria and is treated with antibiotics. I have prescribed Augmentin 875mg /125mg  one tablet twice daily with food, for 7 days. Flonase 2 sprays in each nostril once a day You may use an oral decongestant such as Mucinex D or if you have glaucoma or high blood pressure use plain Mucinex. Saline nasal spray help and can safely be used as often as needed for congestion.  If you develop worsening sinus pain, fever or notice severe headache and vision changes, or if symptoms are not better after completion of antibiotic, please schedule an appointment with a health care provider.    Sinus infections are not as easily transmitted as other respiratory infection, however we still recommend that you avoid close contact with loved ones, especially the very young and elderly.  Remember to wash your hands thoroughly throughout the day as this is the number one way to prevent the spread of infection!  Home Care:  Only take medications as instructed by your medical team.  Complete the entire course of an antibiotic.  Do not take these medications with alcohol.  A steam or ultrasonic humidifier can help congestion.  You can place a towel over your head and breathe in the steam from hot water coming from a faucet.  Avoid close contacts especially the very young and the elderly.  Cover your mouth when you cough or sneeze.  Always remember to wash your hands.  Get Help Right Away If:  You develop worsening fever or sinus pain.  You develop a severe head ache or visual changes.  Your symptoms persist after you have completed your treatment plan.  Make sure  you  Understand these instructions.  Will watch your condition.  Will get help right away if you are not doing well or get worse.  Your e-visit answers were reviewed by a board certified advanced clinical practitioner to complete your personal care plan.  Depending on the condition, your plan could have included both over the counter or prescription medications.  If there is a problem please reply  once you have received a response from your provider.  Your safety is important to us.  If you have drug allergies check your prescription carefully.    You can use MyChart to ask questions about today's visit, request a non-urgent call back, or ask for a work or school excuse for 24 hours related to this e-Visit. If it has been greater than 24 hours you will need to follow up with your provider, or enter a new e-Visit to address those concerns.  You will get an e-mail in the next two days asking about your experience.  I hope that your e-visit has been valuable and will speed your recovery. Thank you for using e-visits.

## 2017-10-19 MED FILL — EMGALITY 120 MG/ML SOAJ: 120 | 29 days supply | Qty: 1 | Fill #4

## 2017-11-09 ENCOUNTER — Ambulatory Visit: Payer: Self-pay | Admitting: Family Medicine

## 2017-11-09 VITALS — BP 124/78 | HR 87 | Temp 98.6°F | Resp 16 | Ht 61.0 in | Wt 128.0 lb

## 2017-11-09 DIAGNOSIS — T7840XA Allergy, unspecified, initial encounter: Secondary | ICD-10-CM

## 2017-11-09 DIAGNOSIS — L509 Urticaria, unspecified: Secondary | ICD-10-CM

## 2017-11-09 MED ORDER — METHYLPREDNISOLONE SODIUM SUCC 125 MG IJ SOLR
125.0000 mg | Freq: Once | INTRAMUSCULAR | Status: AC
  Administered 2017-11-09: 125 mg via INTRAMUSCULAR

## 2017-11-09 MED ORDER — PREDNISONE 10 MG (21) PO TBPK: ORAL_TABLET | ORAL | 0 refills | Status: DC

## 2017-11-09 MED FILL — predniSONE 10 MG (21) TBPK: 10 | 6 days supply | Qty: 21 | Fill #0

## 2017-11-09 NOTE — Progress Notes (Signed)
[name] is a 27 y.o. female who presents today with concerns of allergic reaction that began a 9 am this morning. She denies any knowledge of new contact of known history of allergies to food or environmental. She denies any respiratory symptoms or shortness of breath. She does report that the rash is worsening as the day progresses and she is itchy.  Review of Systems  Constitutional: Negative for chills, fever and malaise/fatigue.  HENT: Negative for congestion, ear discharge, ear pain, sinus pain and sore throat.   Eyes: Negative.   Respiratory: Negative for cough, sputum production and shortness of breath.   Cardiovascular: Negative.  Negative for chest pain.  Gastrointestinal: Negative for abdominal pain, diarrhea, nausea and vomiting.  Genitourinary: Negative for dysuria, frequency, hematuria and urgency.  Musculoskeletal: Negative for myalgias.  Skin: Positive for itching and rash.       Arms, legs and chest rash x a few hours  Neurological: Negative for headaches.  Endo/Heme/Allergies: Negative.   Psychiatric/Behavioral: Negative.     O: Vitals:   11/09/17 1441  BP: 124/78  Pulse: 87  Resp: 16  Temp: 98.6 F (37 C)  SpO2: 96%     Physical Exam  Constitutional: She is oriented to person, place, and time. Vital signs are normal. She appears well-developed and well-nourished. She is active.  Non-toxic appearance. She does not have a sickly appearance.  HENT:  Head: Normocephalic.  Right Ear: Hearing, tympanic membrane, external ear and ear canal normal.  Left Ear: Hearing, tympanic membrane, external ear and ear canal normal.  Nose: Nose normal.  Mouth/Throat: Uvula is midline and oropharynx is clear and moist.  Neck: Normal range of motion. Neck supple.  Cardiovascular: Normal rate, regular rhythm, normal heart sounds and normal pulses.  Pulmonary/Chest: Effort normal and breath sounds normal.  Abdominal: Soft. Bowel sounds are normal.  Musculoskeletal: Normal  range of motion.  Lymphadenopathy:       Head (right side): No submental and no submandibular adenopathy present.       Head (left side): No submental and no submandibular adenopathy present.    She has no cervical adenopathy.  Neurological: She is alert and oriented to person, place, and time.  Skin:     Psychiatric: She has a normal mood and affect.  Vitals reviewed.    A: 1. Allergic reaction, initial encounter   2. Hives      P: Discussed exam findings, diagnosis etiology and medication use and indications reviewed with patient. Follow- Up and discharge instructions provided. No emergent/urgent issues found on exam.  Patient verbalized understanding of information provided and agrees with plan of care (POC), all questions answered.  PLAN< F/U with PCP, neurologist to inform Use Zyrtec 10 mg oral nightly as directed for at least 2 weeks Use Benadryl 25 mg -50 mg prn for symptoms Take oral steroid prescribed in the AM with food as ordered  1. Allergic reaction, initial encounter - methylPREDNISolone sodium succinate (SOLU-MEDROL) 125 mg/2 mL injection 125 mg - predniSONE (STERAPRED UNI-PAK 21 TAB) 10 MG (21) TBPK tablet; Take 6 tablets with breakfast and then 1 less tablet each morning with food. (6,5,4,3,2,1).  2. Hives - predniSONE (STERAPRED UNI-PAK 21 TAB) 10 MG (21) TBPK tablet; Take 6 tablets with breakfast and then 1 less tablet each morning with food. (6,5,4,3,2,1).  Other orders - EMGALITY 120 MG/ML SOAJ; Inject 120 mLs as directed every 30 (thirty) days.

## 2017-11-09 NOTE — Patient Instructions (Addendum)
PLAN< F/U with PCP, neurologist to inform Use Zyrtec 10 mg oral nightly as directed for at least 2 weeks Use Benadryl 25 mg -50 mg prn for symptoms Take oral steroid prescribed in the AM with food as ordered  Allergies An allergy is when your body reacts to a substance in a way that is not normal. An allergic reaction can happen after you:  Eat something.  Breathe in something.  Touch something.  You can be allergic to:  Things that are only around during certain seasons, like molds and pollens.  Foods.  Drugs.  Insects.  Animal dander.  What are the signs or symptoms?  Puffiness (swelling). This may happen on the lips, face, tongue, mouth, or throat.  Sneezing.  Coughing.  Breathing loudly (wheezing).  Stuffy nose.  Tingling in the mouth.  A rash.  Itching.  Itchy, red, puffy areas of skin (hives).  Watery eyes.  Throwing up (vomiting).  Watery poop (diarrhea).  Dizziness.  Feeling faint or fainting.  Trouble breathing or swallowing.  A tight feeling in the chest.  A fast heartbeat. How is this diagnosed? Allergies can be diagnosed with:  A medical and family history.  Skin tests.  Blood tests.  A food diary. A food diary is a record of all the foods, drinks, and symptoms you have each day.  The results of an elimination diet. This diet involves making sure not to eat certain foods and then seeing what happens when you start eating them again.  How is this treated? There is no cure for allergies, but allergic reactions can be treated with medicine. Severe reactions usually need to be treated at a hospital. How is this prevented? The best way to prevent an allergic reaction is to avoid the thing you are allergic to. Allergy shots and medicines can also help prevent reactions in some cases. This information is not intended to replace advice given to you by your health care provider. Make sure you discuss any questions you have with your  health care provider. Document Released: 07/15/2012 Document Revised: 11/15/2015 Document Reviewed: 12/30/2013 Elsevier Interactive Patient Education  [employer]2018 Elsevier Inc.

## 2017-11-12 ENCOUNTER — Telehealth: Payer: Self-pay

## 2017-11-12 DIAGNOSIS — T7840XA Allergy, unspecified, initial encounter: Secondary | ICD-10-CM | POA: Diagnosis not present

## 2017-11-12 DIAGNOSIS — L508 Other urticaria: Secondary | ICD-10-CM | POA: Diagnosis not present

## 2017-11-12 DIAGNOSIS — R21 Rash and other nonspecific skin eruption: Secondary | ICD-10-CM | POA: Diagnosis not present

## 2017-11-12 NOTE — Telephone Encounter (Signed)
Patient states she is doing good, she went to her PC this morning and they did an allergy panel test to rule out what was the cause of the rash,.

## 2017-11-22 MED FILL — EMGALITY 120 MG/ML SOAJ: 120 | 29 days supply | Qty: 1 | Fill #5

## 2017-12-05 ENCOUNTER — Ambulatory Visit: Payer: Self-pay | Admitting: Allergy

## 2018-04-03 NOTE — L&D Delivery Note (Signed)
Delivery Note - requested to deliver for Dr Pamala Hurry in OR  First Stage: labor mode: induced - elective labor (contractions) onset: 0900 - pitocin induced AROM at 1321 analgesia /anesthesia intrapartum: epidural  CNM called for delivery 1506  Second Stage: complete dilation at 1513 onset of pushing at 1520 with Faculty Practice MD CNM arrival in room - assumed care @ 1525 FHR second stage category 2 to 3 - variable decels with prolonged decel in 90 > 5 minutes with onset of pushing - o2 by mask initiated upon CNM request   delivery of a viable female at 34 by CNM in ROT position [facility] lowered by nursing staff with retraction of presenting part and rotation to ROP HOB elevated with maternal effort to facilitate delivery double nuchal cord noted - first wrap reduced prior to head delivery and 2nd loop slid down body at birth  cord double clamped after cessation of pulsation, cut by FOB cord blood sample collected  arterial cord blood sample collected and sent  Third Stage: placenta delivered Shultz intact with 3 VC @ 1537 - pitocin IVF as uterotonic placenta disposition: hospital disposal uterine tone firm / bleeding small complications: double nuchal cord - second stage fetal bradycardia  Small superficial abrasion bleeding - single 3-0 VR for hemostatis  anesthesia for repair: epidural repair none estimated blood loss (mL): 100  mom to postpartum / baby to Couplet care / Skin to Skin.  Newborn: Apgar Scores: 1-minute: 9                           5-minute: 9   feeding plan: breastfeeding birth weight: pending  Natalie Marks CNM, MSN, [facility] 12/19/2018, 3:45 PM

## 2018-06-11 DIAGNOSIS — Z3201 Encounter for pregnancy test, result positive: Secondary | ICD-10-CM | POA: Diagnosis not present

## 2018-06-25 DIAGNOSIS — Z118 Encounter for screening for other infectious and parasitic diseases: Secondary | ICD-10-CM | POA: Diagnosis not present

## 2018-06-25 DIAGNOSIS — Z3481 Encounter for supervision of other normal pregnancy, first trimester: Secondary | ICD-10-CM | POA: Diagnosis not present

## 2018-06-25 DIAGNOSIS — Z3689 Encounter for other specified antenatal screening: Secondary | ICD-10-CM | POA: Diagnosis not present

## 2018-06-25 LAB — OB RESULTS CONSOLE RUBELLA ANTIBODY, IGM: Rubella: IMMUNE

## 2018-06-25 LAB — OB RESULTS CONSOLE RPR: RPR: NONREACTIVE

## 2018-06-25 LAB — OB RESULTS CONSOLE HIV ANTIBODY (ROUTINE TESTING): HIV: NONREACTIVE

## 2018-06-25 LAB — OB RESULTS CONSOLE GC/CHLAMYDIA
Chlamydia: NEGATIVE
Gonorrhea: NEGATIVE

## 2018-06-25 LAB — OB RESULTS CONSOLE ABO/RH: RH Type: POSITIVE

## 2018-06-25 LAB — OB RESULTS CONSOLE HEPATITIS B SURFACE ANTIGEN: Hepatitis B Surface Ag: NEGATIVE

## 2018-06-25 LAB — OB RESULTS CONSOLE ANTIBODY SCREEN: Antibody Screen: NEGATIVE

## 2018-07-15 DIAGNOSIS — J018 Other acute sinusitis: Secondary | ICD-10-CM | POA: Diagnosis not present

## 2018-08-02 DIAGNOSIS — O26899 Other specified pregnancy related conditions, unspecified trimester: Secondary | ICD-10-CM | POA: Diagnosis not present

## 2018-08-06 DIAGNOSIS — O26892 Other specified pregnancy related conditions, second trimester: Secondary | ICD-10-CM | POA: Diagnosis not present

## 2018-08-06 DIAGNOSIS — Z3481 Encounter for supervision of other normal pregnancy, first trimester: Secondary | ICD-10-CM | POA: Diagnosis not present

## 2018-08-06 DIAGNOSIS — Z361 Encounter for antenatal screening for raised alphafetoprotein level: Secondary | ICD-10-CM | POA: Diagnosis not present

## 2018-08-06 DIAGNOSIS — Z3A19 19 weeks gestation of pregnancy: Secondary | ICD-10-CM | POA: Diagnosis not present

## 2018-08-07 MED FILL — PRENAISSANCE PLUS SOFTGEL: 28-1-250 | 30 days supply | Qty: 30 | Fill #0

## 2018-08-22 ENCOUNTER — Ambulatory Visit (INDEPENDENT_AMBULATORY_CARE_PROVIDER_SITE_OTHER)
Admission: RE | Admit: 2018-08-22 | Discharge: 2018-08-22 | Disposition: A | Discharge status: Home / Self Care | Payer: 59 | Source: Ambulatory Visit | Attending: Internal Medicine | Admitting: Internal Medicine

## 2018-08-22 ENCOUNTER — Other Ambulatory Visit: Payer: Self-pay

## 2018-08-22 ENCOUNTER — Other Ambulatory Visit: Payer: Self-pay | Admitting: Internal Medicine

## 2018-08-22 DIAGNOSIS — M79644 Pain in right finger(s): Secondary | ICD-10-CM | POA: Diagnosis not present

## 2018-08-22 DIAGNOSIS — S6991XA Unspecified injury of right wrist, hand and finger(s), initial encounter: Secondary | ICD-10-CM

## 2018-08-22 NOTE — Addendum Note (Signed)
Addended by: Jaynee Eagles C on: 08/22/2018 08:53 AM   Modules accepted: Orders

## 2018-09-04 MED FILL — SM ACID REDUCER 20 MG TAB: 20 | 25 days supply | Qty: 50 | Fill #0

## 2018-10-02 DIAGNOSIS — Z23 Encounter for immunization: Secondary | ICD-10-CM | POA: Diagnosis not present

## 2018-10-02 DIAGNOSIS — O26893 Other specified pregnancy related conditions, third trimester: Secondary | ICD-10-CM | POA: Diagnosis not present

## 2018-10-02 DIAGNOSIS — Z3A28 28 weeks gestation of pregnancy: Secondary | ICD-10-CM | POA: Diagnosis not present

## 2018-10-02 DIAGNOSIS — Z3689 Encounter for other specified antenatal screening: Secondary | ICD-10-CM | POA: Diagnosis not present

## 2018-10-22 MED FILL — FLUTICASONE PROP 50 MCG SPR: 50 | 30 days supply | Qty: 16 | Fill #0

## 2018-11-12 MED FILL — FAMOTIDINE 20 MG TABLET: 20 | 30 days supply | Qty: 60 | Fill #1

## 2018-11-26 DIAGNOSIS — O3663X0 Maternal care for excessive fetal growth, third trimester, not applicable or unspecified: Secondary | ICD-10-CM | POA: Diagnosis not present

## 2018-11-26 DIAGNOSIS — Z3A35 35 weeks gestation of pregnancy: Secondary | ICD-10-CM | POA: Diagnosis not present

## 2018-11-26 DIAGNOSIS — O99013 Anemia complicating pregnancy, third trimester: Secondary | ICD-10-CM | POA: Diagnosis not present

## 2018-11-26 DIAGNOSIS — Z3685 Encounter for antenatal screening for Streptococcus B: Secondary | ICD-10-CM | POA: Diagnosis not present

## 2018-11-26 LAB — OB RESULTS CONSOLE GBS: GBS: NEGATIVE

## 2018-12-10 DIAGNOSIS — Z3A37 37 weeks gestation of pregnancy: Secondary | ICD-10-CM | POA: Diagnosis not present

## 2018-12-10 DIAGNOSIS — O99113 Other diseases of the blood and blood-forming organs and certain disorders involving the immune mechanism complicating pregnancy, third trimester: Secondary | ICD-10-CM | POA: Diagnosis not present

## 2018-12-13 ENCOUNTER — Encounter (HOSPITAL_COMMUNITY): Payer: Self-pay | Admitting: *Deleted

## 2018-12-13 ENCOUNTER — Other Ambulatory Visit: Payer: Self-pay | Admitting: Advanced Practice Midwife

## 2018-12-13 ENCOUNTER — Telehealth (HOSPITAL_COMMUNITY): Payer: Self-pay | Admitting: *Deleted

## 2018-12-13 NOTE — Telephone Encounter (Signed)
Preadmission screen  

## 2018-12-16 ENCOUNTER — Encounter (HOSPITAL_COMMUNITY): Payer: Self-pay | Admitting: *Deleted

## 2018-12-16 ENCOUNTER — Inpatient Hospital Stay (EMERGENCY_DEPARTMENT_HOSPITAL)
Admission: AD | Admit: 2018-12-16 | Discharge: 2018-12-16 | Disposition: A | Discharge status: Home / Self Care | Payer: 59 | Source: Home / Self Care | Attending: Obstetrics | Admitting: Obstetrics

## 2018-12-16 ENCOUNTER — Other Ambulatory Visit: Payer: Self-pay | Admitting: Obstetrics

## 2018-12-16 ENCOUNTER — Ambulatory Visit (HOSPITAL_COMMUNITY)
Admission: RE | Admit: 2018-12-16 | Discharge: 2018-12-16 | Disposition: A | Discharge status: Home / Self Care | Payer: 59 | Source: Ambulatory Visit | Attending: Obstetrics | Admitting: Obstetrics

## 2018-12-16 ENCOUNTER — Other Ambulatory Visit: Payer: Self-pay

## 2018-12-16 DIAGNOSIS — K219 Gastro-esophageal reflux disease without esophagitis: Secondary | ICD-10-CM | POA: Diagnosis not present

## 2018-12-16 DIAGNOSIS — O9962 Diseases of the digestive system complicating childbirth: Secondary | ICD-10-CM | POA: Diagnosis not present

## 2018-12-16 DIAGNOSIS — O471 False labor at or after 37 completed weeks of gestation: Secondary | ICD-10-CM | POA: Insufficient documentation

## 2018-12-16 DIAGNOSIS — D649 Anemia, unspecified: Secondary | ICD-10-CM | POA: Diagnosis not present

## 2018-12-16 DIAGNOSIS — Z20828 Contact with and (suspected) exposure to other viral communicable diseases: Secondary | ICD-10-CM | POA: Insufficient documentation

## 2018-12-16 DIAGNOSIS — Z3A38 38 weeks gestation of pregnancy: Secondary | ICD-10-CM

## 2018-12-16 DIAGNOSIS — Z01812 Encounter for preprocedural laboratory examination: Secondary | ICD-10-CM | POA: Insufficient documentation

## 2018-12-16 DIAGNOSIS — Z3A39 39 weeks gestation of pregnancy: Secondary | ICD-10-CM | POA: Diagnosis not present

## 2018-12-16 DIAGNOSIS — O9902 Anemia complicating childbirth: Secondary | ICD-10-CM | POA: Diagnosis not present

## 2018-12-16 LAB — SARS CORONAVIRUS 2 (TAT 6-24 HRS): SARS Coronavirus 2: NEGATIVE

## 2018-12-16 NOTE — MAU Provider Note (Signed)
Ms. Natalie Marks is a [id number] at [redacted]w[redacted]d seen in MAU for labor. RN labor check, not seen by provider.  SVE by RN Dilation: 4 Effacement (%): 60 Cervical Position: Middle, Anterior Station: -2 Presentation: Vertex Exam by:: Glena Norfolk, RN  Cervix unchanged after 1.5 hours  NST - FHR: 130 bpm / moderate variability / accels present / decels absent / TOCO: regular every 2-5 mins   Assessment/Plan:  False Labor after 37 wks D/C home with labor precautions Keep scheduled appt with WOB  Laury Deep, CNM  12/16/2018 8:13 PM

## 2018-12-16 NOTE — MAU Note (Signed)
Asymptomatic, swab collected. 

## 2018-12-16 NOTE — MAU Note (Signed)
Visit to office this morning with contractions, 4cm/50 per pt. MD instructed to ambulated and return if contractions increased in frequency or intensity. Denies VB, LOF

## 2018-12-17 ENCOUNTER — Other Ambulatory Visit (HOSPITAL_COMMUNITY)
Admission: RE | Admit: 2018-12-17 | Discharge: 2018-12-17 | Disposition: A | Discharge status: Home / Self Care | Payer: 59 | Source: Ambulatory Visit

## 2018-12-18 NOTE — H&P (Signed)
Natalie Marks is a 28 y.o. [id number] at [redacted]w[redacted]d presenting for elective IOL. Pt notes prodromal contractions for the past several days. She has also been to MAU and to office for work-in visits w/o findings of ROM or active labor. Pt does have advanced cervical dilation . Good fetal movement, No vaginal bleeding, not leaking fluid.  PNCare at [facility] since 9 wks - Dated by LMP c/w 9 wk u/s - GBS neg - advanced cervical dilation - anemia, on iron - GERD   Prenatal Transfer Tool  Maternal Diabetes: No Genetic Screening: Normal Maternal Ultrasounds/Referrals: Normal Fetal Ultrasounds or other Referrals:  None Maternal Substance Abuse:  No Significant Maternal Medications:  None Significant Maternal Lab Results: Group B Strep negative     OB History    Gravida  3   Para  2   Term  2   Preterm  0   AB  0   Living  2     SAB  0   TAB  0   Ectopic  0   Multiple  0   Live Births  2          Past Medical History:  Diagnosis Date  . Anemia   . Headache(784.0)   . Postpartum care following vaginal delivery (4/6) 07/08/2015   Past Surgical History:  Procedure Laterality Date  . NO PAST SURGERIES     Family History: family history includes Diabetes in her maternal grandmother. Social History:  reports that she has never smoked. She has never used smokeless tobacco. She reports that she does not drink alcohol or use drugs.  Review of Systems - Negative except discomfrot of pregnancy, intermittent contractoins    Physical Exam on 12/19/18 at 1pm:  Gen: well appearing, no distress Back: no CVAT Abd: gravid, NT, no RUQ pain LE: trace edema, equal bilaterally, non-tender Toco: q 65min FH: baseline 150s, accelerations present, no deceleratons, 10 beat variability Ctx 5cm/ 50%/ vtx -2, mid, medium, AROM clear  Prenatal labs: ABO, Rh: O/Positive/-- (03/24 0000) Antibody: Negative (03/24 0000) Rubella: Immune (03/24 0000) RPR: Nonreactive (03/24 0000)   HBsAg: Negative (03/24 0000)  HIV: Non-reactive (03/24 0000)  GBS: Negative/-- (08/25 0000)  1 hr Glucola 121  Genetic screening nl quad Anatomy US normal   Assessment/Plan: 28 y.o. [id number] at [redacted]w[redacted]d - term pregnancy, elective IOL. Plan pit 2x2 and AROM. Expect quick labor and delivery - GBS neg - Anemia, watch CBC, h/o PPH as G1   Dylon Correa A Aaro Meyers 12/18/2018, 10:51 PM  Ala Dach 12/19/2018 4:53 PM

## 2018-12-19 ENCOUNTER — Inpatient Hospital Stay (HOSPITAL_COMMUNITY): Payer: 59 | Admitting: Anesthesiology

## 2018-12-19 ENCOUNTER — Inpatient Hospital Stay (HOSPITAL_COMMUNITY)
Admission: AD | Admit: 2018-12-19 | Discharge: 2018-12-20 | DRG: 807 | Disposition: A | Discharge status: Home / Self Care | Payer: 59 | Attending: Obstetrics | Admitting: Obstetrics

## 2018-12-19 ENCOUNTER — Other Ambulatory Visit: Payer: Self-pay

## 2018-12-19 ENCOUNTER — Encounter (HOSPITAL_COMMUNITY): Payer: Self-pay | Admitting: *Deleted

## 2018-12-19 ENCOUNTER — Inpatient Hospital Stay (HOSPITAL_COMMUNITY): Payer: 59

## 2018-12-19 DIAGNOSIS — Z3A39 39 weeks gestation of pregnancy: Secondary | ICD-10-CM | POA: Diagnosis not present

## 2018-12-19 DIAGNOSIS — Z0000 Encounter for general adult medical examination without abnormal findings: Secondary | ICD-10-CM | POA: Diagnosis present

## 2018-12-19 DIAGNOSIS — Z20828 Contact with and (suspected) exposure to other viral communicable diseases: Secondary | ICD-10-CM | POA: Diagnosis present

## 2018-12-19 DIAGNOSIS — O9902 Anemia complicating childbirth: Secondary | ICD-10-CM | POA: Diagnosis not present

## 2018-12-19 DIAGNOSIS — K219 Gastro-esophageal reflux disease without esophagitis: Secondary | ICD-10-CM | POA: Diagnosis present

## 2018-12-19 DIAGNOSIS — O9962 Diseases of the digestive system complicating childbirth: Secondary | ICD-10-CM | POA: Diagnosis present

## 2018-12-19 DIAGNOSIS — D649 Anemia, unspecified: Secondary | ICD-10-CM | POA: Diagnosis present

## 2018-12-19 DIAGNOSIS — O26893 Other specified pregnancy related conditions, third trimester: Secondary | ICD-10-CM | POA: Diagnosis present

## 2018-12-19 DIAGNOSIS — Z3490 Encounter for supervision of normal pregnancy, unspecified, unspecified trimester: Secondary | ICD-10-CM | POA: Diagnosis present

## 2018-12-19 LAB — TYPE AND SCREEN
ABO/RH(D): O POS
Antibody Screen: NEGATIVE

## 2018-12-19 LAB — CBC
HCT: 31.1 % — ABNORMAL LOW (ref 36.0–46.0)
Hemoglobin: 10 g/dL — ABNORMAL LOW (ref 12.0–15.0)
MCH: 28.2 pg (ref 26.0–34.0)
MCHC: 32.2 g/dL (ref 30.0–36.0)
MCV: 87.9 fL (ref 80.0–100.0)
Platelets: 144 10*3/uL — ABNORMAL LOW (ref 150–400)
RBC: 3.54 MIL/uL — ABNORMAL LOW (ref 3.87–5.11)
RDW: 14.1 % (ref 11.5–15.5)
WBC: 6.3 10*3/uL (ref 4.0–10.5)
nRBC: 0 % (ref 0.0–0.2)

## 2018-12-19 LAB — ABO/RH: ABO/RH(D): O POS

## 2018-12-19 LAB — RPR: RPR Ser Ql: NONREACTIVE

## 2018-12-19 MED ORDER — LIDOCAINE HCL (PF) 1 % IJ SOLN: 30.0000 mL | INTRAMUSCULAR | Status: DC | PRN

## 2018-12-19 MED ORDER — FERROUS SULFATE 325 (65 FE) MG PO TABS
325.0000 mg | ORAL_TABLET | Freq: Every day | ORAL | Status: DC
  Administered 2018-12-20: 325 mg via ORAL
  Filled 2018-12-19: qty 1

## 2018-12-19 MED ORDER — ACETAMINOPHEN 325 MG PO TABS: 650.0000 mg | ORAL_TABLET | ORAL | Status: DC | PRN

## 2018-12-19 MED ORDER — WITCH HAZEL-GLYCERIN EX PADS: 1.0000 "application " | MEDICATED_PAD | CUTANEOUS | Status: DC | PRN

## 2018-12-19 MED ORDER — SOD CITRATE-CITRIC ACID 500-334 MG/5ML PO SOLN: 30.0000 mL | ORAL | Status: DC | PRN

## 2018-12-19 MED ORDER — LACTATED RINGERS IV SOLN: 500.0000 mL | INTRAVENOUS | Status: DC | PRN

## 2018-12-19 MED ORDER — OXYTOCIN BOLUS FROM INFUSION: 500.0000 mL | Freq: Once | INTRAVENOUS | Status: DC

## 2018-12-19 MED ORDER — EPHEDRINE 5 MG/ML INJ: 10.0000 mg | INTRAVENOUS | Status: DC | PRN

## 2018-12-19 MED ORDER — DIPHENHYDRAMINE HCL 50 MG/ML IJ SOLN: 12.5000 mg | INTRAMUSCULAR | Status: DC | PRN

## 2018-12-19 MED ORDER — OXYTOCIN 40 UNITS IN NORMAL SALINE INFUSION - SIMPLE MED: 2.5000 [IU]/h | INTRAVENOUS | Status: DC

## 2018-12-19 MED ORDER — TERBUTALINE SULFATE 1 MG/ML IJ SOLN: 0.2500 mg | Freq: Once | INTRAMUSCULAR | Status: DC | PRN

## 2018-12-19 MED ORDER — FENTANYL-BUPIVACAINE-NACL 0.5-0.125-0.9 MG/250ML-% EP SOLN
12.0000 mL/h | EPIDURAL | Status: DC | PRN
  Filled 2018-12-19: qty 250

## 2018-12-19 MED ORDER — SODIUM CHLORIDE (PF) 0.9 % IJ SOLN
INTRAMUSCULAR | Status: DC | PRN
  Administered 2018-12-19: 11 mL/h via EPIDURAL

## 2018-12-19 MED ORDER — OXYTOCIN 40 UNITS IN NORMAL SALINE INFUSION - SIMPLE MED
1.0000 m[IU]/min | INTRAVENOUS | Status: DC
  Administered 2018-12-19: 2 m[IU]/min via INTRAVENOUS
  Filled 2018-12-19: qty 1000

## 2018-12-19 MED ORDER — DIBUCAINE (PERIANAL) 1 % EX OINT: 1.0000 "application " | TOPICAL_OINTMENT | CUTANEOUS | Status: DC | PRN

## 2018-12-19 MED ORDER — BENZOCAINE-MENTHOL 20-0.5 % EX AERO
1.0000 "application " | INHALATION_SPRAY | CUTANEOUS | Status: DC | PRN
  Administered 2018-12-19: 1 via TOPICAL
  Filled 2018-12-19: qty 56

## 2018-12-19 MED ORDER — SIMETHICONE 80 MG PO CHEW: 80.0000 mg | CHEWABLE_TABLET | ORAL | Status: DC | PRN

## 2018-12-19 MED ORDER — IBUPROFEN 600 MG PO TABS
600.0000 mg | ORAL_TABLET | Freq: Four times a day (QID) | ORAL | Status: DC
  Administered 2018-12-19 – 2018-12-20 (×4): 600 mg via ORAL
  Filled 2018-12-19 (×4): qty 1

## 2018-12-19 MED ORDER — LACTATED RINGERS IV SOLN: 500.0000 mL | Freq: Once | INTRAVENOUS | Status: DC

## 2018-12-19 MED ORDER — ONDANSETRON HCL 4 MG/2ML IJ SOLN: 4.0000 mg | Freq: Four times a day (QID) | INTRAMUSCULAR | Status: DC | PRN

## 2018-12-19 MED ORDER — COCONUT OIL OIL: 1.0000 "application " | TOPICAL_OIL | Status: DC | PRN

## 2018-12-19 MED ORDER — PHENYLEPHRINE 40 MCG/ML (10ML) SYRINGE FOR IV PUSH (FOR BLOOD PRESSURE SUPPORT): 80.0000 ug | PREFILLED_SYRINGE | INTRAVENOUS | Status: DC | PRN

## 2018-12-19 MED ORDER — LIDOCAINE HCL (PF) 1 % IJ SOLN
INTRAMUSCULAR | Status: DC | PRN
  Administered 2018-12-19 (×2): 4 mL via EPIDURAL

## 2018-12-19 MED ORDER — SENNOSIDES-DOCUSATE SODIUM 8.6-50 MG PO TABS
2.0000 | ORAL_TABLET | ORAL | Status: DC
  Administered 2018-12-19: 2 via ORAL
  Filled 2018-12-19: qty 2

## 2018-12-19 MED ORDER — LACTATED RINGERS IV SOLN
INTRAVENOUS | Status: DC
  Administered 2018-12-19: 08:00:00 via INTRAVENOUS

## 2018-12-19 NOTE — Anesthesia Preprocedure Evaluation (Signed)
Anesthesia Evaluation  Patient identified by MRN, date of birth, ID band Patient awake    Reviewed: Allergy & Precautions, Patient's Chart, lab work & pertinent test results  Airway Mallampati: II  TM Distance: >3 FB Neck ROM: Full    Dental  (+) Teeth Intact   Pulmonary neg pulmonary ROS,    Pulmonary exam normal breath sounds clear to auscultation       Cardiovascular negative cardio ROS Normal cardiovascular exam Rhythm:Regular Rate:Normal     Neuro/Psych  Headaches, negative psych ROS   GI/Hepatic Neg liver ROS, GERD  Medicated and Controlled,  Endo/Other  negative endocrine ROS  Renal/GU negative Renal ROS  negative genitourinary   Musculoskeletal negative musculoskeletal ROS (+)   Abdominal   Peds  Hematology  (+) anemia ,   Anesthesia Other Findings   Reproductive/Obstetrics (+) Pregnancy                             Anesthesia Physical Anesthesia Plan  ASA: II  Anesthesia Plan: Epidural   Post-op Pain Management:    Induction:   PONV Risk Score and Plan: Treatment may vary due to age or medical condition  Airway Management Planned: Natural Airway  Additional Equipment:   Intra-op Plan:   Post-operative Plan:   Informed Consent: I have reviewed the patients History and Physical, chart, labs and discussed the procedure including the risks, benefits and alternatives for the proposed anesthesia with the patient or authorized representative who has indicated his/her understanding and acceptance.       Plan Discussed with: Anesthesiologist  Anesthesia Plan Comments:         Anesthesia Quick Evaluation

## 2018-12-19 NOTE — Lactation Note (Signed)
This note was copied from a baby's chart. Lactation Consultation Note  Patient Name: Natalie Marks [id number] Date: 12/19/2018 Reason for consult: Initial assessment;Term P3, 2 hour female infant. 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 Per mom, infant latched well in L&D for 20 minutes and she feels breastfeeding is going well. This is mom's third time breastfeeding, she breastfed 1st child 3 weeks due milk allegory.  infant was put on soybase formula and 2nd child who is 43 years old for 14 months. Mom knowledgeable about breastfeeding.  LC did not observed latch at this time, per parents,  infant breastfed 30 minutes prior to entering room. Parents have been doing STS. Mom knows to breastfeed according hunger cues, 8 to 12 times within 24 hours and on demand. Per mom, she  already knows how to hand express. Mom knows to call Nurse or [facility] if she has any questions, concerns or need assistance with latching infant to breast. Mom made aware of O/P services, breastfeeding support groups, community resources, and our phone # for post-discharge questions.   Maternal Data Formula Feeding for Exclusion: No Has patient been taught Hand Expression?: Yes(per mom, she knows how to hand express.) Does the patient have breastfeeding experience prior to this delivery?: Yes  Feeding Feeding Type: Breast Fed  LATCH Score             Interventions Interventions: Breast feeding basics reviewed;DEBP;Skin to skin;Hand express  Lactation Tools Discussed/Used WIC Program: No   Consult Status Consult Status: Follow-up Date: 12/20/18 Follow-up type: In-patient    Vicente Serene 12/19/2018, 5:55 PM

## 2018-12-19 NOTE — Anesthesia Procedure Notes (Signed)
Epidural Patient location during procedure: OB Start time: 12/19/2018 2:35 PM End time: 12/19/2018 2:43 PM  Staffing Anesthesiologist: Josephine Igo, MD Performed: anesthesiologist   Preanesthetic Checklist Completed: patient identified, site marked, surgical consent, pre-op evaluation, timeout performed, IV checked, risks and benefits discussed and monitors and equipment checked  Epidural Patient position: sitting Prep: site prepped and draped and DuraPrep Patient monitoring: continuous pulse ox and blood pressure Approach: midline Location: L3-L4 Injection technique: LOR air  Needle:  Needle type: Tuohy  Needle gauge: 17 G Needle length: 9 cm and 9 Needle insertion depth: 4 cm Catheter type: closed end flexible Catheter size: 19 Gauge Catheter at skin depth: 9 cm Test dose: negative and Other  Assessment Events: blood not aspirated, injection not painful, no injection resistance, negative IV test and no paresthesia  Additional Notes Patient identified. Risks and benefits discussed including failed block, incomplete  Pain control, post dural puncture headache, nerve damage, paralysis, blood pressure Changes, nausea, vomiting, reactions to medications-both toxic and allergic and post Partum back pain. All questions were answered. Patient expressed understanding and wished to proceed. Sterile technique was used throughout procedure. Epidural site was Dressed with sterile barrier dressing. No paresthesias, signs of intravascular injection Or signs of intrathecal spread were encountered.  Patient was more comfortable after the epidural was dosed. Please see RN's note for documentation of vital signs and FHR which are stable. Reason for block:procedure for pain

## 2018-12-20 LAB — CBC
HCT: 31.4 % — ABNORMAL LOW (ref 36.0–46.0)
Hemoglobin: 10 g/dL — ABNORMAL LOW (ref 12.0–15.0)
MCH: 28.3 pg (ref 26.0–34.0)
MCHC: 31.8 g/dL (ref 30.0–36.0)
MCV: 89 fL (ref 80.0–100.0)
Platelets: 148 10*3/uL — ABNORMAL LOW (ref 150–400)
RBC: 3.53 MIL/uL — ABNORMAL LOW (ref 3.87–5.11)
RDW: 14 % (ref 11.5–15.5)
WBC: 9.9 10*3/uL (ref 4.0–10.5)
nRBC: 0 % (ref 0.0–0.2)

## 2018-12-20 MED ORDER — COCONUT OIL OIL: 1.0000 "application " | TOPICAL_OIL | 0 refills | Status: DC | PRN

## 2018-12-20 MED ORDER — BENZOCAINE-MENTHOL 20-0.5 % EX AERO: 1.0000 "application " | INHALATION_SPRAY | CUTANEOUS | Status: DC | PRN

## 2018-12-20 MED ORDER — INFLUENZA VAC SPLIT QUAD 0.5 ML IM SUSY: 0.5000 mL | PREFILLED_SYRINGE | INTRAMUSCULAR | Status: DC

## 2018-12-20 MED ORDER — IBUPROFEN 600 MG PO TABS: 600.0000 mg | ORAL_TABLET | Freq: Four times a day (QID) | ORAL | 0 refills | Status: DC

## 2018-12-20 MED FILL — IBUPROFEN 600 MG TABLET: 600 | 8 days supply | Qty: 30 | Fill #0

## 2018-12-20 NOTE — Lactation Note (Signed)
This note was copied from a baby's chart. Lactation Consultation Note  Patient Name: Natalie Marks [id number] Date: 12/20/2018 Reason for consult: Follow-up assessment;Infant weight loss;Term Baby is 19 hours  LC reviewed and updated the doc flow sheets per mom .  Baby awake and hungry. Mom latched and LC just needed to assist to  Ease the chine down for a deeper latch.  LC recommended to mom to work on getting baby to open wide and use the  Cross cradle to start and then switch to the cradle position.  L:C stressed the importance of STS feedings until the baby is back to  Birth weight and gaining steadily.  Sore nipple and engorgement prevention and tx reviewed.  Per mom has HAAKA ( hand pump ), a DEBP Medela and has ordered  Her Spectra.  Mom aware of the [facility] resources.    Maternal Data Has patient been taught Hand Expression?: Yes  Feeding Feeding Type: Breast Fed  LATCH Score Latch: Grasps breast easily, tongue down, lips flanged, rhythmical sucking.  Audible Swallowing: Spontaneous and intermittent  Type of Nipple: Everted at rest and after stimulation  Comfort (Breast/Nipple): Soft / non-tender  Hold (Positioning): Assistance needed to correctly position infant at breast and maintain latch.  LATCH Score: 9  Interventions Interventions: Breast feeding basics reviewed;Assisted with latch;Skin to skin;Adjust position;Breast compression;Support pillows;Position options  Lactation Tools Discussed/Used Pump Review: Milk Storage   Consult Status Consult Status: Complete Date: 12/20/18    Myer Haff 12/20/2018, 10:43 AM

## 2018-12-20 NOTE — Plan of Care (Signed)
All discharge instructions given and complete to Patient and Significant other, both receptive to teaching. Patient received AVS.

## 2018-12-20 NOTE — Anesthesia Postprocedure Evaluation (Signed)
Anesthesia Post Note  Patient: Ange Lennon  Procedure(s) Performed: AN AD HOC LABOR EPIDURAL     Patient location during evaluation: Mother Baby Anesthesia Type: Epidural Level of consciousness: awake and alert and oriented Pain management: satisfactory to patient Vital Signs Assessment: post-procedure vital signs reviewed and stable Respiratory status: respiratory function stable Cardiovascular status: stable Postop Assessment: no headache, epidural receding, patient able to bend at knees, no signs of nausea or vomiting and adequate PO intake Anesthetic complications: no Comments: The patient complained of an ache to the left and just below the needle insertion site this morning which she attributed it to the epidural and the "not so comfortable hospital bed."   Follow up was offered, but was declined for now.    Last Vitals:  Vitals:   12/19/18 2322 12/20/18 0326  BP: (!) 97/57 (!) 94/58  Pulse: 88 63  Resp: 18 17  Temp: 36.6 C 36.8 C  SpO2: 100% 100%    Last Pain:  Vitals:   12/20/18 0554  TempSrc:   PainSc: 0-No pain   Pain Goal: Patients Stated Pain Goal: 3 (12/19/18 1745)                 Katherina Mires

## 2018-12-20 NOTE — Discharge Summary (Addendum)
Discharge Summary  Patient Name: Natalie Marks DOB: January 18, 1991 MRN: [id number]  Date of admission: 12/19/2018 Delivering provider: Artelia Laroche   Admitting diagnosis: pregnancy Intrauterine pregnancy: [redacted]w[redacted]d     Secondary diagnosis:Principal Problem:   Postpartum care following vaginal delivery (9/17) Active Problems:   Encounter for induction of labor   SVD (spontaneous vaginal delivery)  Additional problems: None   Date of discharge: 12/20/2018   Discharge diagnosis:  Patient Active Problem List   Diagnosis Date Noted  . Encounter for induction of labor 12/19/2018  . SVD (spontaneous vaginal delivery) 12/19/2018  . Postpartum care following vaginal delivery (9/17) 12/19/2018                                                                Post partum procedures: None  Augmentation: Pitocin Pain control: Epidural  Laceration:None  Episiotomy:None  Complications: None  Hospital course:  Induction of Labor With Vaginal Delivery   28 y.o. yo [phone] at [redacted]w[redacted]d was admitted to the hospital 12/19/2018 for induction of labor.  Indication for induction: Elective.  Patient had an uncomplicated labor course as follows: Membrane Rupture Time/Date: 1:21 PM ,12/19/2018   Intrapartum Procedures: Episiotomy: None [1]                                         Lacerations:  None [1]  Patient had delivery of a Viable infant.  Information for the patient's newborn:  Eldana, Isip [[id number]]  Delivery Method: Vaginal, Spontaneous(Filed from Delivery Summary)    12/19/2018  Details of delivery can be found in separate delivery note.  Patient had a routine postpartum course. Patient is discharged home 12/20/18.  Physical exam  Vitals:   12/19/18 1700 12/19/18 1745 12/19/18 2322 12/20/18 0326  BP: 108/66 103/65 (!) 97/57 (!) 94/58  Pulse: 78 68 88 63  Resp: 16 18 18 17   Temp:  98.1 F (36.7 C) 97.9 F (36.6 C) 98.3 F (36.8 C)  TempSrc:  Oral Oral Oral  SpO2:  100% 100% 100%   Weight:      Height:       General: alert, cooperative and no distress Lochia: appropriate Uterine Fundus: firm Incision: N/A DVT Evaluation: No evidence of DVT seen on physical exam. Labs: Lab Results  Component Value Date   WBC 9.9 12/20/2018   HGB 10.0 (L) 12/20/2018   HCT 31.4 (L) 12/20/2018   MCV 89.0 12/20/2018   PLT 148 (L) 12/20/2018   CMP Latest Ref Rng & Units 06/28/2017  Glucose 70 - 99 mg/dL 111(H)  BUN 6 - 23 mg/dL 9  Creatinine 0.40 - 1.20 mg/dL 0.68  Sodium 135 - 145 mEq/L 138  Potassium 3.5 - 5.1 mEq/L 3.6  Chloride 96 - 112 mEq/L 104  CO2 19 - 32 mEq/L 26  Calcium 8.4 - 10.5 mg/dL 9.1  Total Protein 6.0 - 8.3 g/dL 7.8  Total Bilirubin 0.2 - 1.2 mg/dL 0.3  Alkaline Phos 39 - 117 U/L 43  AST 0 - 37 U/L 14  ALT 0 - 35 U/L 10    Vaccines: TDaP UTD         Flu    Ordered  Discharge instruction: per  After Visit Summary and "Baby and Me Booklet".  After Visit Meds:  Allergies as of 12/20/2018      Reactions   Macrobid [nitrofurantoin Macrocrystal] Hives      Medication List    TAKE these medications   acetaminophen 500 MG tablet Commonly known as: TYLENOL Take 500 mg by mouth every 6 (six) hours as needed for headache.   benzocaine-Menthol 20-0.5 % Aero Commonly known as: DERMOPLAST Apply 1 application topically as needed for irritation (perineal discomfort).   coconut oil Oil Apply 1 application topically as needed.   ibuprofen 600 MG tablet Commonly known as: ADVIL Take 1 tablet (600 mg total) by mouth every 6 (six) hours.   IRON PO Take 1 tablet by mouth daily.   multivitamin-prenatal 27-0.8 MG Tabs tablet Take 1 tablet by mouth daily at 12 noon.            Discharge Care Instructions  (From admission, onward)         Start     Ordered   12/20/18 0000  Discharge wound care:    Comments: Sitz baths 2 times /day with warm water x 1 week. May add herbals: 1 ounce dried comfrey leaf* 1 ounce calendula flowers 1 ounce  lavender flowers 1/2 ounce dried uva ursi leaves 1/2 ounce witch hazel blossoms (if you can find them) 1/2 ounce dried sage leaf 1/2 cup sea salt Directions: Bring 2 quarts of water to a boil. Turn off heat, and place 1 ounce (approximately 1 large handful) of the above mixed herbs (not the salt) into the pot. Steep, covered, for 30 minutes.  Strain the liquid well with a fine mesh strainer, and discard the herb material. Add 2 quarts of liquid to the tub, along with the 1/2 cup of salt. This medicinal liquid can also be made into compresses and peri-rinses.   12/20/18 0932          Diet: routine diet  Activity: Advance as tolerated. Pelvic rest for 6 weeks.   Postpartum contraception: Not Discussed  Newborn Data: Live born female  Birth Weight: 8 lb 13.6 oz (4014 g) APGAR: 9, 9  Newborn Delivery   Birth date/time: 12/[birth year]/2018 15:33:00 Delivery type: Vaginal, Spontaneous      named Turner [place] Baby Feeding: Breast Disposition:home with mother   Delivery Report: Review the Delivery Report for details.    Follow up: Follow-up Information    Noland FordyceFogleman, Kelly, MD. Schedule an appointment as soon as possible for a visit in 6 week(s).   Specialty: Obstetrics and Gynecology Contact information: [address] [place] [address] [phone]             Signed: Lynnell GrainBrandy N Norton, SNM, MSN 12/20/2018, 10:29 AM   Medical screening examination/treatment/procedure(s) were conducted as a shared visit with non-physician practitioner(s) and myself.  I personally evaluated the patient during the encounter.   Neta Mendsaniela C Kaiana Marion, CNM, MSN 12/20/2018, 4:18 PM

## 2018-12-20 NOTE — Progress Notes (Addendum)
Postpartum Day # 1   Delivering provider: Artelia Laroche  Pain control at delivery: Epidural  Episiotomy:None  Lacerations:None   Live born female  Birth Weight: 8 lb 13.6 oz (4014 g) APGAR: 9, 9  Newborn Delivery   Birth date/time: 12/19/2016 15:33:00 Delivery type: Vaginal, Spontaneous      Baby name: Mayer Camel Feeding: breast circumcision planned  S:  Reports feeling well, ready for d/c today.             Tolerating po/ No nausea or vomiting             Bleeding is light             Pain controlled with prescription NSAID's including ibuprofen             Up ad lib / ambulatory / voiding without difficulties   O:  A & O x 3, in no apparent distress              VS:  Vitals:   12/19/18 1700 12/19/18 1745 12/19/18 2322 12/20/18 0326  BP: 108/66 103/65 (!) 97/57 (!) 94/58  Pulse: 78 68 88 63  Resp: 16 18 18 17   Temp:  98.1 F (36.7 C) 97.9 F (36.6 C) 98.3 F (36.8 C)  TempSrc:  Oral Oral Oral  SpO2:  100% 100% 100%  Weight:      Height:        LABS:  Recent Labs    12/19/18 0813 12/20/18 0558  WBC 6.3 9.9  HGB 10.0* 10.0*  HCT 31.1* 31.4*  PLT 144* 148*    Blood type: --/--/O POS, O POS Performed at [facility], [place] [address], [place], [place] [address]  [phone])  Rubella: Immune (03/24 0000)   I&O: I/O last 3 completed shifts: In: 0  Out: 700 [Urine:600; Blood:100]          No intake/output data recorded.  Vaccines: TDaP UTD         Flu    Ordered   Lungs: Clear and unlabored  Heart: regular rate and rhythm / no murmurs  Abdomen: soft, non-tender, non-distended             Fundus: firm, non-tender, U-1  Perineum: intact, no edema  Lochia: light  Extremities: No edema, no calf pain or tenderness    A/P: PPD # 1 28 y.o., [id number]   Principal Problem:   Postpartum care following vaginal delivery (9/17) Active Problems:   Encounter for induction of labor   SVD (spontaneous vaginal delivery)   Doing well - stable status  Circumcision  - plans outpatient d/t cost, resources given  Routine post partum orders  Anticipate discharge today   Juanna Cao, SNM, MSN 12/20/2018, 8:30 AM   Medical screening examination/treatment/procedure(s) were conducted as a shared visit with non-physician practitioner(s) and myself.  I personally evaluated the patient during the encounter.   Juliene Pina, CNM, MSN 12/20/2018, 3:54 PM

## 2018-12-20 NOTE — Anesthesia Postprocedure Evaluation (Signed)
Anesthesia Post Note  Patient: Natalie Marks  Procedure(s) Performed: AN AD HOC LABOR EPIDURAL     Patient location during evaluation: Mother Baby Anesthesia Type: Epidural Level of consciousness: awake and alert Pain management: pain level controlled Vital Signs Assessment: post-procedure vital signs reviewed and stable Respiratory status: spontaneous breathing, nonlabored ventilation and respiratory function stable Cardiovascular status: stable Postop Assessment: no headache, no backache and epidural receding Anesthetic complications: no    Last Vitals:  Vitals:   12/19/18 2322 12/20/18 0326  BP: (!) 97/57 (!) 94/58  Pulse: 88 63  Resp: 18 17  Temp: 36.6 C 36.8 C  SpO2: 100% 100%    Last Pain:  Vitals:   12/20/18 0554  TempSrc:   PainSc: 0-No pain   Pain Goal: Patients Stated Pain Goal: 3 (12/19/18 1745)                 Barkley Boards

## 2019-01-06 DIAGNOSIS — Z3482 Encounter for supervision of other normal pregnancy, second trimester: Secondary | ICD-10-CM | POA: Diagnosis not present

## 2019-01-06 DIAGNOSIS — Z3483 Encounter for supervision of other normal pregnancy, third trimester: Secondary | ICD-10-CM | POA: Diagnosis not present

## 2019-01-24 DIAGNOSIS — Z23 Encounter for immunization: Secondary | ICD-10-CM | POA: Diagnosis not present

## 2019-02-10 DIAGNOSIS — Z3482 Encounter for supervision of other normal pregnancy, second trimester: Secondary | ICD-10-CM | POA: Diagnosis not present

## 2019-02-10 DIAGNOSIS — Z3483 Encounter for supervision of other normal pregnancy, third trimester: Secondary | ICD-10-CM | POA: Diagnosis not present

## 2019-02-18 MED FILL — PRENAISSANCE PLUS SOFTGEL: 28-1-250 | 30 days supply | Qty: 30 | Fill #1

## 2019-03-10 DIAGNOSIS — Z3482 Encounter for supervision of other normal pregnancy, second trimester: Secondary | ICD-10-CM | POA: Diagnosis not present

## 2019-03-10 DIAGNOSIS — Z3483 Encounter for supervision of other normal pregnancy, third trimester: Secondary | ICD-10-CM | POA: Diagnosis not present

## 2019-03-14 DIAGNOSIS — Z30430 Encounter for insertion of intrauterine contraceptive device: Secondary | ICD-10-CM | POA: Diagnosis not present

## 2019-03-14 DIAGNOSIS — Z3202 Encounter for pregnancy test, result negative: Secondary | ICD-10-CM | POA: Diagnosis not present

## 2019-04-16 MED FILL — FLUTICASONE PROP 50 MCG SPR: 50 | 30 days supply | Qty: 16 | Fill #1

## 2019-04-21 MED FILL — BUSPIRONE HCL 7.5 MG TABS: 7.5 | 30 days supply | Qty: 60 | Fill #0

## 2019-04-21 MED FILL — PARoxetine HCL 10 MG TABS: 10 | 30 days supply | Qty: 30 | Fill #0

## 2019-04-23 ENCOUNTER — Ambulatory Visit: Payer: Self-pay

## 2019-04-23 NOTE — Lactation Note (Addendum)
This note was copied from a baby's chart. Lactation Consultation Note  Patient Name: Natalie Marks Date: 04/23/2019     04/23/2019  Name: Natalie Marks MRN: [id number] Date of Birth: 12/19/2016 Gestational Age: Gestational Age: [redacted]w[redacted]d Birth Weight: 141.6 oz Weight today:  Weight: 11 lb 15.4 oz (3348 g)   82 month old infant presents today with mom for feeding assessment. Mom reports concerns with weight gain and clicking at the breast.   Mom was not able to continue BF this first infant due to lactose intolerance. 2nd infant with slow weight gain and was BF and formula fed. Mom does not wish to formula feed this infant. Mom did supplement infant with 1 ounce of pumped breast milk after each feeding and infant spitting increased significantly.   Infant has gained 1522 grams in the last 124 days with an average daily weight gain of 12 grams a day. Infant has dropped from the 90% for weight to the 1%. Mom aware that infant has dropped significantly.   Mom reports infant was clicking in the beginning and then it went away and then infant started clicking again recently. Infant is pulling on and off the breast with feeding. Infant is very gassy per mom. Infant is pulling legs up with gas. She has used gas gtts and it does not help much. Infant does not spit much per mom unless extra volume is given.   Infant has started to reawaken at night and dream feeding for about 5-10 minutes. Infant is feeding every 2-3 hours for 10-45 minutes per feeding. Infant is bottle fed on the weekends when mom is at work. Infant takes about 3-4 4 ounce bottles during that time. He is using the Avent Level 1 nipple. Mom is not aware that he is choking or drooling on the bottle. Infant is being paced bottle fed.   Mom is pumping at work 3-4 times when working and gets 3-5 ounces per pumping. Infant is being exclusively fed by breast milk. She is using some pumped milk from the freezer, but not  often. Mom was using the # 24 flanges and recently changed to the # 19 flanges and noted an increase in amounts and faster emptying.   Infant with thin labial frenulum that inserts at the bottom of the gum ridge, upper lip tight with flanging. Infant pulls on and off the breast with feeding. Infant clicked throughout the feeding. Infant is distractable with feeding most likely due to age. Infant with high palate. Infant with tongue thrusting and biting on finger. Infant with hump to back of tongue when suckling. Mom reports she was sore in the beginning 2 months and pain has increased some recently. Mom given information on tongue and lip restrictions and how they can effect milk supply and milk transfer. Mom is planning to get infant evaluated. Records release signed to send records to Dr. Rogelia Rohrer.   Infant did not feed well in the office. He was happy and interactive. Reviewed normal 4 month distractions and enc mom to feed in dark quiet room if needed. Mom's milk supply seems to have decreased some, discussed increasing pumping during the week to see if that will help with transfer and weight gain.  Infant to follow up with Dr. Pricilla Holm on Friday. Infant to follow up with Lactation as needed or 1-5 days post tongue and lip releases if completed.       General Information: Mother's reason for visit: Feeding assessment, slow weight gain Consult:  Initial Lactation consultant: Natalie Mattes RN,IBCLC Breastfeeding experience: latching frequently, pulling on and off the breast, clicking at the breast Maternal medical conditions: Other(PP Anxiety- Starting Buspar daily and Paxil prn) Maternal medications: Pre-natal vitamin, Other  Breastfeeding History: Frequency of breast feeding: every 2-3 hours Duration of feeding: 10-45 minutes  Supplementation: Supplement method: bottle(Avent Level 1)         Breast milk volume: 4 ounces Breast milk frequency: 3-4 x a day on the weekends Total breast milk  volume per day: 12-16 ounces Pump type: Other(Motif) Pump frequency: 3-4 times Pump volume: 2.5-5 ounces  Infant Output Assessment: Voids per 24 hours: 8+ Urine color: Clear yellow Stools per 24 hours: 2-3 Stool color: Yellow  Breast Assessment: Breast: Soft, Compressible Nipple: Erect Pain level: 0 Pain interventions: Bra, Breast pump  Feeding Assessment: Infant oral assessment: Variance Infant oral assessment comment: see note Positioning: Cradle(right breast, 5 minutes) Latch: 1 - Repeated attempts needed to sustain latch, nipple held in mouth throughout feeding, stimulation needed to elicit sucking reflex. Audible swallowing: 1 - A few with stimulation Type of nipple: 2 - Everted at rest and after stimulation Comfort: 2 - Soft/non-tender Hold: 2 - No assistance needed to correctly position infant at breast LATCH score: 8 Latch assessment: Deep Lips flanged: Yes Suck assessment: Displays both   Pre-feed weight: 5426 grams Post feed weight: 5450 grams Amount transferred: 24 ml Amount supplemented: 0  Additional Feeding Assessment:                                    Totals: Total amount transferred: 26 ml-infant not interested in feeding Total supplement given: 0 Total amount pumped post feed: did not pump   Plan:  1. Offer infant the breast with feeding cues 2. Offer both breasts with each feeding 3. Empty the first breast before offering the second breast 4. Offer infant a bottle of pumped breast milk if he is still hungry once he has BF both breasts 5. Infant needs about 101-135 ml (3.5-4.5 ounces) for 8 feedings a day or [phone] ml (27-36 ounces) in 24 hours. Infant may take more or less depending on how often infant feeds. Feed infant until infant is satisfied.  6. Would recommend offering a bottle of pumped milk a day 7. Add in 1-2 pumpings a day with your double electric breast pump to help maintain and increase supply. Pump for 15-20  minutes and feed all milk back to infant as able 8. Keep up the good work 60. Thank you for allowing me to assist you today 10. Please call and any questions/concerns as needed [phone] 11. Follow up with Lactation as needed or 1-5 days post tongue and lip releases if completed  [facility] RN, IBCLC                                                   Natalie Marks 04/23/2019, 8:22 AM

## 2019-04-30 ENCOUNTER — Ambulatory Visit: Payer: Self-pay

## 2019-04-30 NOTE — Lactation Note (Signed)
This note was copied from a baby's chart. Lactation Consultation Note  Patient Name: Natalie Marks Natalie Marks Date: 04/30/2019     04/30/2019  Name: Natalie Marks MRN: [id number] Date of Birth: 12/19/2016 Gestational Age: Gestational Age: [redacted]w[redacted]d Birth Weight: 141.6 oz Weight today:  Weight: 12 lb 7.9 oz (5666 g)   Infant present today with mom for follow up feeding assessment post tongue and lip ties on 1/25 by Dr. Ronny Flurry.   Infant has gained 240 grams in the last 7 days with an average daily weight gain of 34 grams a day.   Mom has been BF infant at least every 3 hours or sooner if he wants it and then offering pumped breast milk and/or formula with each feeding. Infant is being carried in a sling so mom can detect feeding cues. Infant with minimal spitting since increasing feeds. Mom reports infant is not gulping as much and has less clicking on the breast.   Infant is being supplemented after each BF and mom has increased daytime feedings to every 3 hours and less often as night. Infant took 15 ounces last weekend when she was at work.   Infant with granulation tissue to upper lip. Upper lip tight with flanging. Infant with diamond shaped granulation tissue under tongue. Tongue stretched well today. Infant with less clicking on the breast. Mom with no pain with feeding.   Infant fed briefly in the office. Mom is pumping more now. Mom is using some formula. Reviewed herbs and medications that can help with milk production. mom reports she took Fenugreek and had some difficulty with her blood sugars.   Infant to follow in 2 weeks for weight check. Infant to follow up with Lactation as needed. Mom to call as needed.         General Information: Mother's reason for visit: Follow up feeding assessment, post tongue and lip releases on 1/25 by Dr. Ronny Flurry Consult: Follow-up Lactation consultant: Natalie Mattes RN,IBCLC Breastfeeding experience: latching with each feeding,  supplementing post feeding Maternal medical conditions: Other(PP anxiety) Maternal medications: Pre-natal vitamin, Other(Buspar and Paxil)  Breastfeeding History: Frequency of breast feeding: every 3 hours during the day and 3-4 hours at night Duration of feeding: 10-20 minutes  Supplementation: Supplement method: bottle(Avent Level 1) Brand: Enfamil Formula volume: 3 ounces Formula frequency: up to 10 ounces   Breast milk volume: 2-3 ounces Breast milk frequency: after most feedings   Pump type: Other(Motif) Pump frequency: every 3 hours Pump volume: 1-4 ounces  Infant Output Assessment: Voids per 24 hours: 8+ Urine color: Clear yellow Stools per 24 hours: 2-3 Stool color: Yellow  Breast Assessment: Breast: Soft, Compressible Nipple: Erect Pain level: 0 Pain interventions: Bra  Feeding Assessment: Infant oral assessment: Variance Infant oral assessment comment: see note Positioning: Cross cradle Latch: 1 - Repeated attempts needed to sustain latch, nipple held in mouth throughout feeding, stimulation needed to elicit sucking reflex. Audible swallowing: 2 - Spontaneous and intermittent Type of nipple: 2 - Everted at rest and after stimulation Comfort: 2 - Soft/non-tender Hold: 2 - No assistance needed to correctly position infant at breast LATCH score: 9   Lips flanged: Yes Suck assessment: Displays both   Pre-feed weight: 5666 grams Post feed weight: 5692 grams Amount transferred: 26 ml Amount supplemented: 0  Additional Feeding Assessment:  Totals: Total amount transferred: 26 ml Total supplement given: mom to supplement Total amount pumped post feed: did not pump   Plan:   1. Offer infant the breast with feeding cues 2. Offer both breasts with each feeding 3. Empty the first breast before offering the second breast 4. Offer infant a bottle of pumped breast milk if he is still hungry once he has BF  both breasts 5. Infant needs about 105-140 ml (3.5-4.5  ounces) for 8 feedings a day or 325-369-4915 ml (28+ounces) in 24 hours. Infant may take more or less depending on how often infant feeds. Feed infant until infant is satisfied.  6. Change to the Dr. Theora Gianotti Level 1 narrow based nipple for feeding 7. Pump for 15-20 minutes and feed all milk back to infant as able. Pump anytime infant is getting a bottle to promote your milk supply.  8. Moringa has been helpful for some mom's for increasing their milk supply. It is usually 300 mg three times a day 9. Reglan has been found to be helpful for some mom's. The usual dosage is 10 mg 3 times a day for 10-30 days. Some OB's will not prescribe. Due to side effects.  10. Keep up the good work 11. Thank you for allowing me to assist you today 12. Please call and any questions/concerns as needed [phone] 13. Follow up with Lactation as needed     Natalie Blalock RN, IBCLC                                                   Natalie Marks Natalie Marks 04/30/2019, 10:17 AM

## 2019-05-13 NOTE — Telephone Encounter (Signed)
error 

## 2019-06-03 DIAGNOSIS — F419 Anxiety disorder, unspecified: Secondary | ICD-10-CM | POA: Diagnosis not present

## 2019-06-03 MED FILL — BUSPIRONE HCL 7.5 MG TABS: 7.5 | 30 days supply | Qty: 60 | Fill #0

## 2019-06-03 MED FILL — PARoxetine HCL 10 MG TABS: 10 | 30 days supply | Qty: 30 | Fill #0

## 2019-06-09 ENCOUNTER — Ambulatory Visit
Admission: EM | Admit: 2019-06-09 | Discharge: 2019-06-09 | Disposition: A | Discharge status: Home / Self Care | Payer: 59

## 2019-06-09 ENCOUNTER — Ambulatory Visit (INDEPENDENT_AMBULATORY_CARE_PROVIDER_SITE_OTHER): Payer: 59

## 2019-06-09 DIAGNOSIS — S93402A Sprain of unspecified ligament of left ankle, initial encounter: Secondary | ICD-10-CM

## 2019-06-09 DIAGNOSIS — M25572 Pain in left ankle and joints of left foot: Secondary | ICD-10-CM

## 2019-06-09 DIAGNOSIS — X501XXA Overexertion from prolonged static or awkward postures, initial encounter: Secondary | ICD-10-CM

## 2019-06-09 DIAGNOSIS — S99912A Unspecified injury of left ankle, initial encounter: Secondary | ICD-10-CM | POA: Diagnosis not present

## 2019-06-09 NOTE — ED Triage Notes (Signed)
Pt states stepped wrong getting out of her Zenaida Niece and her lt ankle rolled and fell on her lt buttocks with her lt ankle under her.

## 2019-06-09 NOTE — Discharge Instructions (Signed)

## 2019-06-09 NOTE — ED Provider Notes (Signed)
[facility]    CSN: [id number] Arrival date & time: 06/09/19  1840      History   Chief Complaint Chief Complaint  Patient presents with  . Ankle Pain    HPI Natalie Marks is a 29 y.o. female presenting for left ankle pain, swelling, difficulty bearing weight status post ankle inversion injury when getting out of her Zenaida Niece earlier this afternoon.  Denies head trauma, LOC.  Has tried icing without significant relief.  Denies numbness.   Past Medical History:  Diagnosis Date  . Anemia   . Headache(784.0)   . Postpartum care following vaginal delivery (4/6) 07/08/2015    Patient Active Problem List   Diagnosis Date Noted  . Encounter for induction of labor 12/19/2018  . SVD (spontaneous vaginal delivery) 12/19/2018  . Postpartum care following vaginal delivery (9/17) 12/19/2018    Past Surgical History:  Procedure Laterality Date  . NO PAST SURGERIES      OB History    Gravida  3   Para  3   Term  3   Preterm  0   AB  0   Living  3     SAB  0   TAB  0   Ectopic  0   Multiple  0   Live Births  3            Home Medications    Prior to Admission medications   Medication Sig Start Date End Date Taking? Authorizing Provider  busPIRone (BUSPAR) 7.5 MG tablet Take 7.5 mg by mouth 2 (two) times daily.   Yes [provider]  PARoxetine (PAXIL) 10 MG tablet Take 10 mg by mouth daily.   Yes [provider]    Family History Family History  Problem Relation Age of Onset  . Diabetes Maternal Grandmother   . Alcohol abuse Neg Hx   . Arthritis Neg Hx   . Asthma Neg Hx   . Birth defects Neg Hx   . Cancer Neg Hx   . COPD Neg Hx   . Depression Neg Hx   . Drug abuse Neg Hx   . Early death Neg Hx   . Hearing loss Neg Hx   . Heart disease Neg Hx   . Hyperlipidemia Neg Hx   . Hypertension Neg Hx   . Kidney disease Neg Hx   . Learning disabilities Neg Hx   . Mental illness Neg Hx   . Mental retardation Neg Hx   .  Miscarriages / Stillbirths Neg Hx   . Stroke Neg Hx   . Vision loss Neg Hx     Social History Social History   Tobacco Use  . Smoking status: Never Smoker  . Smokeless tobacco: Never Used  Substance Use Topics  . Alcohol use: No  . Drug use: No     Allergies   Macrobid [nitrofurantoin macrocrystal]   Review of Systems As per HPI   Physical Exam Triage Vital Signs ED Triage Vitals  Enc Vitals Group     BP      Pulse      Resp      Temp      Temp src      SpO2      Weight      Height      Head Circumference      Peak Flow      Pain Score      Pain Loc      Pain  Edu?      Excl. in GC?    No data found.  Updated Vital Signs BP (!) 148/84 (BP Location: Left Arm)   Pulse 88   Temp 99.1 F (37.3 C) (Oral)   Resp 16   LMP 06/06/2019   SpO2 96%   Breastfeeding Yes   Visual Acuity Right Eye Distance:   Left Eye Distance:   Bilateral Distance:    Right Eye Near:   Left Eye Near:    Bilateral Near:     Physical Exam Constitutional:      General: She is not in acute distress. HENT:     Head: Normocephalic and atraumatic.  Eyes:     General: No scleral icterus.    Pupils: Pupils are equal, round, and reactive to light.  Cardiovascular:     Rate and Rhythm: Normal rate.  Pulmonary:     Effort: Pulmonary effort is normal.  Musculoskeletal:        General: Swelling and tenderness present. No deformity.     Comments: Decreased ROM second to pain.  Patient has lateral and medial malleoli tenderness.  Lateral malleolus and dorsal aspect of proximal foot with moderate edema.  Strength deferred.  Skin:    Coloration: Skin is not jaundiced or pale.     Findings: No bruising.  Neurological:     Mental Status: She is alert and oriented to person, place, and time.     Sensory: No sensory deficit.     Deep Tendon Reflexes: Reflexes normal.      UC Treatments / Results  Labs (all labs ordered are listed, but only abnormal results are displayed) Labs  Reviewed - No data to display  EKG   Radiology DG Ankle Complete Left  Result Date: 06/09/2019 CLINICAL DATA:  Inversion injury EXAM: LEFT ANKLE COMPLETE - 3+ VIEW COMPARISON:  None. FINDINGS: There is no evidence of fracture, dislocation, or joint effusion. There is no evidence of arthropathy or other focal bone abnormality. Soft tissues are unremarkable. IMPRESSION: Negative. Electronically Signed   By: Jasmine Pang M.D.   On: 06/09/2019 19:13    Procedures Procedures (including critical care time)  Medications Ordered in UC Medications - No data to display  Initial Impression / Assessment and Plan / UC Course  I have reviewed the triage vital signs and the nursing notes.  Pertinent labs & imaging results that were available during my care of the patient were reviewed by me and considered in my medical decision making (see chart for details).     Left ankle x-ray done in office, reviewed by me radiology: Negative for fracture, dislocation, discussed intervention.  Reviewed supportive measures, outpatient specialty follow-up if needed.  Return precautions discussed, patient verbalized understanding and is agreeable to plan. Final Clinical Impressions(s) / UC Diagnoses   Final diagnoses:  Moderate left ankle sprain, initial encounter     Discharge Instructions     Recommend RICE: rest, ice, compression, elevation as needed for pain.   Heat therapy (hot compress, warm wash red, hot showers, etc.) can help relax muscles and soothe muscle aches. Cold therapy (ice packs) can be used to help swelling both after injury and after prolonged use of areas of chronic pain/aches.  For pain: recommend 350 mg-1000 mg of Tylenol (acetaminophen) and/or 200 mg - 800 mg of Advil (ibuprofen, Motrin) every 8 hours as needed.  May alternate between the two throughout the day as they are generally safe to take together.  DO NOT exceed more  than 3000 mg of Tylenol or 3200 mg of ibuprofen in a 24 hour  period as this could damage your stomach, kidneys, liver, or increase your bleeding risk.    ED Prescriptions    None     PDMP not reviewed this encounter.   Neldon Mc Hawkins, [place] 06/09/19 1923

## 2019-07-16 ENCOUNTER — Other Ambulatory Visit (HOSPITAL_COMMUNITY): Payer: Self-pay | Admitting: Obstetrics

## 2019-07-16 DIAGNOSIS — F419 Anxiety disorder, unspecified: Secondary | ICD-10-CM | POA: Diagnosis not present

## 2019-07-16 MED FILL — BUPROPION HCL SR 150 MG TAB: 150 | 30 days supply | Qty: 60 | Fill #0

## 2019-07-16 MED FILL — BUSPIRONE HCL 7.5 MG TABS: 7.5 | 90 days supply | Qty: 180 | Fill #0

## 2019-08-19 ENCOUNTER — Other Ambulatory Visit: Payer: Self-pay

## 2019-08-19 ENCOUNTER — Ambulatory Visit (INDEPENDENT_AMBULATORY_CARE_PROVIDER_SITE_OTHER): Payer: 59 | Admitting: Legal Medicine

## 2019-08-19 ENCOUNTER — Encounter: Payer: Self-pay | Admitting: Legal Medicine

## 2019-08-19 VITALS — BP 90/60 | HR 83 | Temp 98.0°F | Resp 16 | Ht 61.0 in | Wt 140.2 lb

## 2019-08-19 DIAGNOSIS — G43009 Migraine without aura, not intractable, without status migrainosus: Secondary | ICD-10-CM

## 2019-08-19 DIAGNOSIS — L508 Other urticaria: Secondary | ICD-10-CM

## 2019-08-19 DIAGNOSIS — F411 Generalized anxiety disorder: Secondary | ICD-10-CM

## 2019-08-19 DIAGNOSIS — Z0000 Encounter for general adult medical examination without abnormal findings: Secondary | ICD-10-CM

## 2019-08-19 HISTORY — DX: Generalized anxiety disorder: F41.1

## 2019-08-19 HISTORY — DX: Other urticaria: L50.8

## 2019-08-19 HISTORY — DX: Migraine without aura, not intractable, without status migrainosus: G43.009

## 2019-08-19 NOTE — Progress Notes (Signed)
Established Patient Office Visit  Subjective:  Patient ID: Natalie Marks, female    DOB: 16-May-1990  Age: 29 y.o. MRN: [id number]  CC:  Chief Complaint  Patient presents with  . 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 G3P3 Ab0L3.  Has migraines controlled.  Past Medical History:  Diagnosis Date  . Anemia   . Generalized anxiety disorder 08/19/2019  . Headache(784.0)   . Migraine without aura, not intractable, without status migrainosus 08/19/2019  . Other urticaria 08/19/2019  . Postpartum care following vaginal delivery (4/6) 07/08/2015    Past Surgical History:  Procedure Laterality Date  . NO PAST SURGERIES      Family History  Problem Relation Age of Onset  . Diabetes Maternal Grandmother   . Alcohol abuse Neg Hx   . Arthritis Neg Hx   . Asthma Neg Hx   . Birth defects Neg Hx   . Cancer Neg Hx   . COPD Neg Hx   . Depression Neg Hx   . Drug abuse Neg Hx   . Early death Neg Hx   . Hearing loss Neg Hx   . Heart disease Neg Hx   . Hyperlipidemia Neg Hx   . Hypertension Neg Hx   . Kidney disease Neg Hx   . Learning disabilities Neg Hx   . Mental illness Neg Hx   . Mental retardation Neg Hx   . Miscarriages / Stillbirths Neg Hx   . Stroke Neg Hx   . Vision loss Neg Hx     Social History   Socioeconomic History  . Marital status: Married    Spouse name: [name]  . Number of children: 3  . Years of education: Not on file  . Highest education level: Not on file  Occupational History  . Occupation: Psychologist, sport and exercise  Tobacco Use  . Smoking status: Never Smoker  . Smokeless tobacco: Never Used  Substance and Sexual Activity  . Alcohol use: No  . Drug use: No  . Sexual activity: Yes    Partners: Male    Birth control/protection: I.U.D.  Other Topics Concern  . Not on file  Social History Narrative  . Not on file   Social Determinants of Health   Financial Resource Strain: Low  Risk   . Difficulty of Paying Living Expenses: Not hard at all  Food Insecurity: No Food Insecurity  . Worried About Programme researcher, broadcasting/film/video in the Last Year: Never true  . Ran Out of Food in the Last Year: Never true  Transportation Needs: No Transportation Needs  . Lack of Transportation (Medical): No  . Lack of Transportation (Non-Medical): No  Physical Activity:   . Days of Exercise per Week:   . Minutes of Exercise per Session:   Stress:   . Feeling of Stress :   Social Connections:   . Frequency of Communication with Friends and Family:   . Frequency of Social Gatherings with Friends and Family:   . Attends Religious Services:   . Active Member of Clubs or Organizations:   . Attends Banker Meetings:   Marland Kitchen Marital Status:   Intimate Partner Violence:   . Fear of Current or Ex-Partner:   . Emotionally Abused:   Marland Kitchen Physically Abused:   . Sexually Abused:     Outpatient Medications Prior to Visit  Medication Sig Dispense Refill  . buPROPion (WELLBUTRIN SR) 150 MG 12  hr tablet TAKE 1 TABLET BY MOUTH DAILY FOR 1 WEEK THEN INCREASE TO 1 TABLET BY MOUTH 2 TIMES DAILY    . busPIRone (BUSPAR) 7.5 MG tablet Take 7.5 mg by mouth 2 (two) times daily.    . fluticasone (FLONASE) 50 MCG/ACT nasal spray     . PARoxetine (PAXIL) 10 MG tablet Take 10 mg by mouth daily.     No facility-administered medications prior to visit.    Allergies  Allergen Reactions  . Macrobid [Nitrofurantoin Macrocrystal] Hives    ROS Review of Systems  Constitutional: Negative.   HENT: Negative.   Eyes: Negative.   Respiratory: Negative.   Cardiovascular: Negative.   Gastrointestinal: Negative.   Endocrine: Negative.   Genitourinary: Negative.   Musculoskeletal: Negative.   Skin: Negative.   Neurological: Negative.   Psychiatric/Behavioral: Negative.       Objective:    Physical Exam  Constitutional: She is oriented to person, place, and time. She appears well-developed and  well-nourished.  HENT:  Head: Normocephalic and atraumatic.  Right Ear: External ear normal.  Left Ear: External ear normal.  Mouth/Throat: Oropharynx is clear and moist.  Eyes: Pupils are equal, round, and reactive to light. Conjunctivae and EOM are normal.  Cardiovascular: Normal rate, regular rhythm, normal heart sounds and intact distal pulses.  Pulmonary/Chest: Effort normal and breath sounds normal.  Abdominal: Soft. Bowel sounds are normal.  Musculoskeletal:        General: Normal range of motion.     Cervical back: Normal range of motion and neck supple.  Neurological: She is alert and oriented to person, place, and time. She has normal reflexes.  Skin: Skin is warm and dry.  Psychiatric: She has a normal mood and affect. Her behavior is normal. Judgment and thought content normal.  Vitals reviewed.   BP 90/60 (BP Location: Right Arm, Patient Position: Sitting)   Pulse 83   Temp 98 F (36.7 C) (Temporal)   Resp 16   Ht 5\' 1"  (1.549 m)   Wt 140 lb 3.2 oz (63.6 kg)   BMI 26.49 kg/m  Wt Readings from Last 3 Encounters:  08/19/19 140 lb 3.2 oz (63.6 kg)  12/19/18 154 lb 15.7 oz (70.3 kg)  12/16/18 155 lb (70.3 kg)     Health Maintenance Due  Topic Date Due  . PAP-Cervical Cytology Screening  Never done  . PAP SMEAR-Modifier  Never done    There are no preventive care reminders to display for this patient.  Lab Results  Component Value Date   TSH 2.76 06/28/2017   Lab Results  Component Value Date   WBC 9.9 12/20/2018   HGB 10.0 (L) 12/20/2018   HCT 31.4 (L) 12/20/2018   MCV 89.0 12/20/2018   PLT 148 (L) 12/20/2018   Lab Results  Component Value Date   NA 138 06/28/2017   K 3.6 06/28/2017   CO2 26 06/28/2017   GLUCOSE 111 (H) 06/28/2017   BUN 9 06/28/2017   CREATININE 0.68 06/28/2017   BILITOT 0.3 06/28/2017   ALKPHOS 43 06/28/2017   AST 14 06/28/2017   ALT 10 06/28/2017   PROT 7.8 06/28/2017   ALBUMIN 4.2 06/28/2017   CALCIUM 9.1 06/28/2017    GFR 112.67 06/28/2017   No results found for: CHOL No results found for: HDL No results found for: LDLCALC No results found for: TRIG No results found for: CHOLHDL No results found for: 06/30/2017    Assessment & Plan:   Problem List Items Addressed This  Visit      Other   Routine general medical examination at a health care facility    Patient doing well and planning nursing career.  Need to get all of her immunizations       Other Visit Diagnoses    Encounter for annual physical exam    -  Primary      No orders of the defined types were placed in this encounter.   Follow-up: No follow-ups on file.    Reinaldo Meeker, MD

## 2019-08-19 NOTE — Assessment & Plan Note (Signed)
Patient doing well and planning nursing career.  Need to get all of her immunizations

## 2019-09-16 ENCOUNTER — Other Ambulatory Visit: Payer: Self-pay

## 2019-09-16 ENCOUNTER — Other Ambulatory Visit: Payer: 59

## 2019-09-16 DIAGNOSIS — Z111 Encounter for screening for respiratory tuberculosis: Secondary | ICD-10-CM

## 2019-09-18 LAB — QUANTIFERON-TB GOLD PLUS
QuantiFERON Mitogen Value: >10 IU/mL
QuantiFERON Nil Value: 0 IU/mL
QuantiFERON TB1 Ag Value: 0.04 IU/mL
QuantiFERON TB2 Ag Value: 0.03 IU/mL
QuantiFERON-TB Gold Plus: NEGATIVE

## 2019-09-19 NOTE — Progress Notes (Signed)
Quantiferon test negative for TB lp

## 2019-10-20 MED FILL — MEDROXYPROGESTERONE 10 MG T: 10 | 10 days supply | Qty: 10 | Fill #0

## 2019-12-14 IMAGING — DX RIGHT THUMB 2+V
3 series · 3 of 3 positions shown · non-contrast
Comparison: None.

CLINICAL DATA: Trauma to the right thumb today with pain.

EXAM:
RIGHT THUMB 2+V

[finger ap]
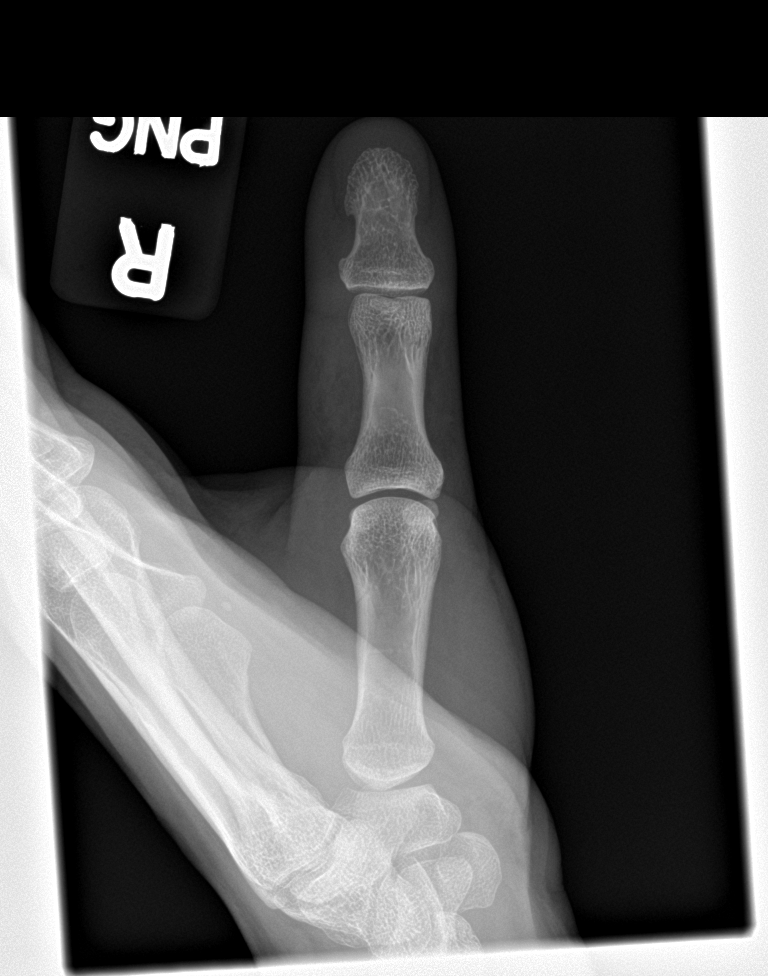

[finger obl]
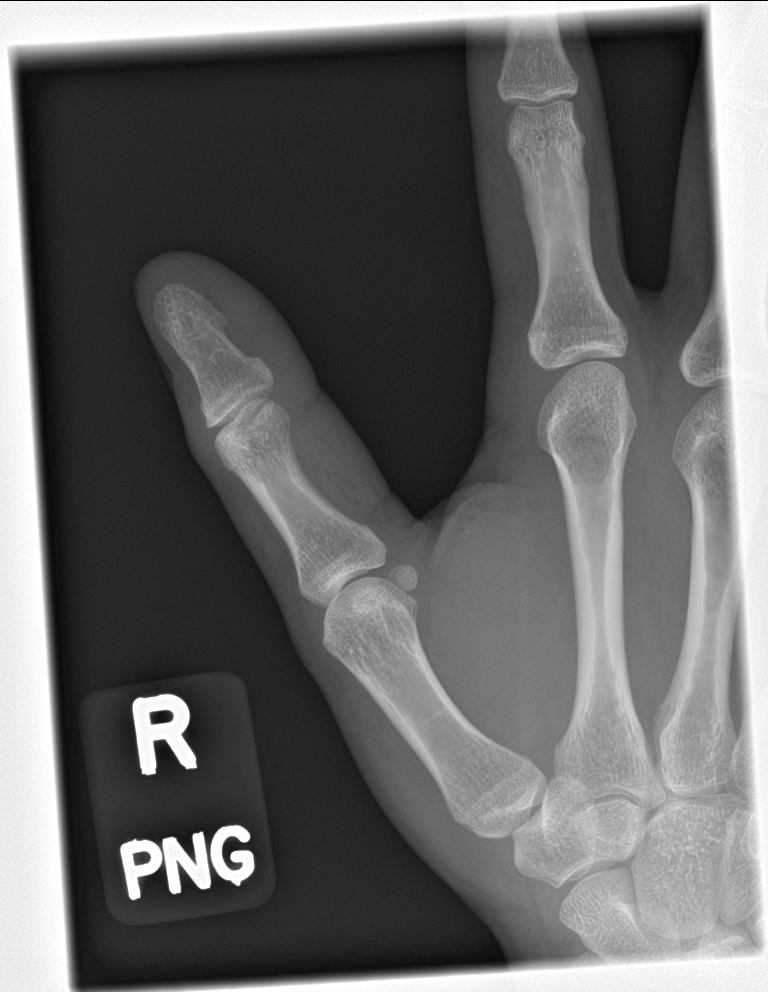

[finger lat]
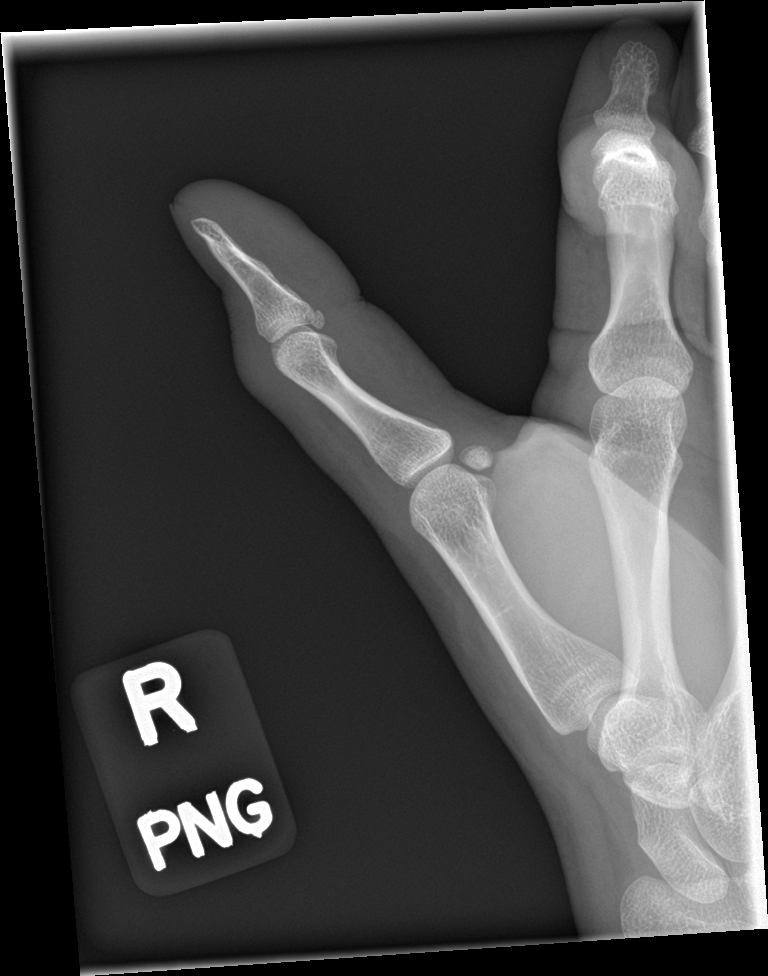

[3 of 3 positions shown; findings below may reference images not displayed]

FINDINGS: There is no evidence of fracture or dislocation. There is no
evidence of arthropathy or other focal bone abnormality. Soft
tissues are unremarkable
IMPRESSION: Negative.

## 2020-01-02 MED FILL — BUPROPION HCL SR 150 MG TAB: 150 | 30 days supply | Qty: 60 | Fill #2

## 2020-01-02 MED FILL — busPIRone HCL 7.5 MG TABS: 7.5 | 90 days supply | Qty: 180 | Fill #1

## 2020-01-08 ENCOUNTER — Other Ambulatory Visit: Payer: Self-pay

## 2020-01-08 ENCOUNTER — Ambulatory Visit (INDEPENDENT_AMBULATORY_CARE_PROVIDER_SITE_OTHER): Payer: 59

## 2020-01-08 DIAGNOSIS — Z23 Encounter for immunization: Secondary | ICD-10-CM | POA: Diagnosis not present

## 2020-01-08 NOTE — Progress Notes (Signed)
° ° °  Patient Name: Natalie Marks Patient DOB: 08-12-1990  01/08/2020  [name] came in today for her 2021-2022 influenza vaccine.  Patient tolerated it well.   Jacklynn Bue

## 2020-01-13 MED FILL — BUPROPION HCL SR 150 MG TAB: 150 | 30 days supply | Qty: 60 | Fill #2

## 2020-01-13 MED FILL — busPIRone HCL 7.5 MG TABS: 7.5 | 90 days supply | Qty: 180 | Fill #1

## 2020-01-15 DIAGNOSIS — F4323 Adjustment disorder with mixed anxiety and depressed mood: Secondary | ICD-10-CM | POA: Diagnosis not present

## 2020-01-22 DIAGNOSIS — F4323 Adjustment disorder with mixed anxiety and depressed mood: Secondary | ICD-10-CM | POA: Diagnosis not present

## 2020-01-29 DIAGNOSIS — F4323 Adjustment disorder with mixed anxiety and depressed mood: Secondary | ICD-10-CM | POA: Diagnosis not present

## 2020-02-17 DIAGNOSIS — F4323 Adjustment disorder with mixed anxiety and depressed mood: Secondary | ICD-10-CM | POA: Diagnosis not present

## 2020-03-04 DIAGNOSIS — F4323 Adjustment disorder with mixed anxiety and depressed mood: Secondary | ICD-10-CM | POA: Diagnosis not present

## 2020-03-18 DIAGNOSIS — F4323 Adjustment disorder with mixed anxiety and depressed mood: Secondary | ICD-10-CM | POA: Diagnosis not present

## 2020-04-05 DIAGNOSIS — F4323 Adjustment disorder with mixed anxiety and depressed mood: Secondary | ICD-10-CM | POA: Diagnosis not present

## 2020-04-19 DIAGNOSIS — F4323 Adjustment disorder with mixed anxiety and depressed mood: Secondary | ICD-10-CM | POA: Diagnosis not present

## 2020-07-26 DIAGNOSIS — F4323 Adjustment disorder with mixed anxiety and depressed mood: Secondary | ICD-10-CM | POA: Diagnosis not present

## 2020-08-09 DIAGNOSIS — F4323 Adjustment disorder with mixed anxiety and depressed mood: Secondary | ICD-10-CM | POA: Diagnosis not present

## 2020-08-23 DIAGNOSIS — F4323 Adjustment disorder with mixed anxiety and depressed mood: Secondary | ICD-10-CM | POA: Diagnosis not present

## 2020-09-07 DIAGNOSIS — F4323 Adjustment disorder with mixed anxiety and depressed mood: Secondary | ICD-10-CM | POA: Diagnosis not present

## 2020-09-20 DIAGNOSIS — F4323 Adjustment disorder with mixed anxiety and depressed mood: Secondary | ICD-10-CM | POA: Diagnosis not present

## 2020-09-23 DIAGNOSIS — Z01411 Encounter for gynecological examination (general) (routine) with abnormal findings: Secondary | ICD-10-CM | POA: Diagnosis not present

## 2020-09-23 DIAGNOSIS — Z13220 Encounter for screening for lipoid disorders: Secondary | ICD-10-CM | POA: Diagnosis not present

## 2020-09-23 DIAGNOSIS — Z6822 Body mass index (BMI) 22.0-22.9, adult: Secondary | ICD-10-CM | POA: Diagnosis not present

## 2020-09-23 DIAGNOSIS — Z1329 Encounter for screening for other suspected endocrine disorder: Secondary | ICD-10-CM | POA: Diagnosis not present

## 2020-09-23 DIAGNOSIS — Z124 Encounter for screening for malignant neoplasm of cervix: Secondary | ICD-10-CM | POA: Diagnosis not present

## 2020-09-23 DIAGNOSIS — Z113 Encounter for screening for infections with a predominantly sexual mode of transmission: Secondary | ICD-10-CM | POA: Diagnosis not present

## 2020-09-23 DIAGNOSIS — Z114 Encounter for screening for human immunodeficiency virus [HIV]: Secondary | ICD-10-CM | POA: Diagnosis not present

## 2020-09-23 DIAGNOSIS — Z30431 Encounter for routine checking of intrauterine contraceptive device: Secondary | ICD-10-CM | POA: Diagnosis not present

## 2020-09-23 DIAGNOSIS — Z0000 Encounter for general adult medical examination without abnormal findings: Secondary | ICD-10-CM | POA: Diagnosis not present

## 2020-09-23 DIAGNOSIS — Z131 Encounter for screening for diabetes mellitus: Secondary | ICD-10-CM | POA: Diagnosis not present

## 2020-09-23 DIAGNOSIS — Z118 Encounter for screening for other infectious and parasitic diseases: Secondary | ICD-10-CM | POA: Diagnosis not present

## 2020-09-23 DIAGNOSIS — Z1159 Encounter for screening for other viral diseases: Secondary | ICD-10-CM | POA: Diagnosis not present

## 2020-09-23 DIAGNOSIS — R87810 Cervical high risk human papillomavirus (HPV) DNA test positive: Secondary | ICD-10-CM | POA: Diagnosis not present

## 2020-09-23 DIAGNOSIS — Z01419 Encounter for gynecological examination (general) (routine) without abnormal findings: Secondary | ICD-10-CM | POA: Diagnosis not present

## 2020-10-05 DIAGNOSIS — F4323 Adjustment disorder with mixed anxiety and depressed mood: Secondary | ICD-10-CM | POA: Diagnosis not present

## 2020-10-14 DIAGNOSIS — N926 Irregular menstruation, unspecified: Secondary | ICD-10-CM | POA: Diagnosis not present

## 2020-10-18 DIAGNOSIS — F4323 Adjustment disorder with mixed anxiety and depressed mood: Secondary | ICD-10-CM | POA: Diagnosis not present

## 2020-10-27 ENCOUNTER — Other Ambulatory Visit (HOSPITAL_COMMUNITY): Payer: Self-pay

## 2020-10-27 DIAGNOSIS — N72 Inflammatory disease of cervix uteri: Secondary | ICD-10-CM | POA: Diagnosis not present

## 2020-10-27 DIAGNOSIS — N871 Moderate cervical dysplasia: Secondary | ICD-10-CM | POA: Diagnosis not present

## 2020-10-27 DIAGNOSIS — R87612 Low grade squamous intraepithelial lesion on cytologic smear of cervix (LGSIL): Secondary | ICD-10-CM | POA: Diagnosis not present

## 2020-10-27 DIAGNOSIS — B977 Papillomavirus as the cause of diseases classified elsewhere: Secondary | ICD-10-CM | POA: Diagnosis not present

## 2020-10-27 DIAGNOSIS — Z3202 Encounter for pregnancy test, result negative: Secondary | ICD-10-CM | POA: Diagnosis not present

## 2020-10-27 DIAGNOSIS — N870 Mild cervical dysplasia: Secondary | ICD-10-CM | POA: Diagnosis not present

## 2020-10-27 DIAGNOSIS — Z7689 Persons encountering health services in other specified circumstances: Secondary | ICD-10-CM | POA: Diagnosis not present

## 2020-10-28 ENCOUNTER — Other Ambulatory Visit (HOSPITAL_COMMUNITY): Payer: Self-pay

## 2020-10-28 MED ORDER — FOLIC ACID 1 MG PO TABS
ORAL_TABLET | ORAL | 3 refills | Status: DC
  Filled 2020-10-28: qty 90, 90d supply, fill #0

## 2020-11-11 DIAGNOSIS — F4323 Adjustment disorder with mixed anxiety and depressed mood: Secondary | ICD-10-CM | POA: Diagnosis not present

## 2020-11-22 ENCOUNTER — Other Ambulatory Visit (HOSPITAL_COMMUNITY): Payer: Self-pay

## 2020-11-22 DIAGNOSIS — N871 Moderate cervical dysplasia: Secondary | ICD-10-CM | POA: Diagnosis not present

## 2020-11-22 MED ORDER — FOLIC ACID 1 MG PO TABS
ORAL_TABLET | ORAL | 3 refills | Status: DC
  Filled 2020-11-22: qty 90, 90d supply, fill #0

## 2020-12-01 DIAGNOSIS — F4323 Adjustment disorder with mixed anxiety and depressed mood: Secondary | ICD-10-CM | POA: Diagnosis not present

## 2020-12-13 DIAGNOSIS — F4323 Adjustment disorder with mixed anxiety and depressed mood: Secondary | ICD-10-CM | POA: Diagnosis not present

## 2020-12-27 DIAGNOSIS — F4323 Adjustment disorder with mixed anxiety and depressed mood: Secondary | ICD-10-CM | POA: Diagnosis not present

## 2021-01-06 ENCOUNTER — Ambulatory Visit: Payer: 59 | Admitting: Nurse Practitioner

## 2021-01-06 ENCOUNTER — Encounter: Payer: Self-pay | Admitting: Nurse Practitioner

## 2021-01-06 ENCOUNTER — Other Ambulatory Visit: Payer: Self-pay

## 2021-01-06 VITALS — BP 118/68 | HR 73 | Temp 97.3°F | Ht 61.0 in | Wt 135.0 lb

## 2021-01-06 DIAGNOSIS — G43009 Migraine without aura, not intractable, without status migrainosus: Secondary | ICD-10-CM

## 2021-01-06 DIAGNOSIS — Z23 Encounter for immunization: Secondary | ICD-10-CM | POA: Diagnosis not present

## 2021-01-06 NOTE — Progress Notes (Signed)
Subjective:  Patient ID: Natalie Marks, female    DOB: December 17, 1990  Age: 30 y.o. MRN: [id number]  Chief Complaint  Patient presents with   Migraine    HPI   SUBJECTIVE:  Natalie Marks is a 30 y.o. female who complains of migraine headache for 15 year(s). She has a well established history of recurrent migraines. ar Description of pain: throbbing pain. Associated symptoms: light sensitivity. Currently takes excedrin and magnesium. Generally has a migraine 2 x a week/8 migraines a month. Has used emgality in the past which helped but had to stop due to being pregnant.  There are no associated abnormal neurological symptoms such as TIA's, loss of balance, loss of vision or speech, numbness or weakness on review. Past neurological history: negative for stroke, MS, epilepsy, or brain tumor.   Current Outpatient Medications  Medication Sig Dispense Refill   folic acid (FOLVITE) 1 MG tablet Take 1 tablet by mouth once a day. 90 tablet 3   No current facility-administered medications for this visit.   Current Outpatient Medications on File Prior to Visit  Medication Sig Dispense Refill   folic acid (FOLVITE) 1 MG tablet Take 1 tablet by mouth once a day. 90 tablet 3   No current facility-administered medications on file prior to visit.   Past Medical History:  Diagnosis Date   Anemia    Generalized anxiety disorder 08/19/2019   Headache(784.0)    Migraine without aura, not intractable, without status migrainosus 08/19/2019   Other urticaria 08/19/2019   Postpartum care following vaginal delivery (4/6) 07/08/2015   Past Surgical History:  Procedure Laterality Date   NO PAST SURGERIES      Family History  Problem Relation Age of Onset   Diabetes Maternal Grandmother    Alcohol abuse Neg Hx    Arthritis Neg Hx    Asthma Neg Hx    Birth defects Neg Hx    Cancer Neg Hx    COPD Neg Hx    Depression Neg Hx    Drug abuse Neg Hx    Early death Neg Hx    Hearing loss Neg Hx     Heart disease Neg Hx    Hyperlipidemia Neg Hx    Hypertension Neg Hx    Kidney disease Neg Hx    Learning disabilities Neg Hx    Mental illness Neg Hx    Mental retardation Neg Hx    Miscarriages / Stillbirths Neg Hx    Stroke Neg Hx    Vision loss Neg Hx    Social History   Socioeconomic History   Marital status: Married    Spouse name: Tim   Number of children: 3   Years of education: Not on file   Highest education level: Not on file  Occupational History   Occupation: Psychologist, sport and exercise  Tobacco Use   Smoking status: Never   Smokeless tobacco: Never  Vaping Use   Vaping Use: Never used  Substance and Sexual Activity   Alcohol use: No   Drug use: No   Sexual activity: Yes    Partners: Male    Birth control/protection: I.U.D.  Other Topics Concern   Not on file  Social History Narrative   Not on file   Social Determinants of Health   Financial Resource Strain: Not on file  Food Insecurity: Not on file  Transportation Needs: Not on file  Physical Activity: Not on file  Stress: Not on file  Social Connections: Not on file  Review of Systems  Constitutional:  Negative for chills, fatigue and fever.  HENT:  Negative for congestion, ear pain, rhinorrhea and sore throat.   Respiratory:  Negative for cough and shortness of breath.   Cardiovascular:  Negative for chest pain.    Objective:  BP 118/68   Pulse 73   Temp (!) 97.3 F (36.3 C)   Ht 5\' 1"  (1.549 m)   Wt 135 lb (61.2 kg)   SpO2 98%   BMI 25.51 kg/m   BP/Weight 01/06/2021 08/19/2019 06/09/2019  Systolic BP 118 90 148  Diastolic BP 68 60 84  Wt. (Lbs) 135 140.2 -  BMI 25.51 26.49 -  Some encounter information is confidential and restricted. Go to Review Flowsheets activity to see all data.    Physical Exam Vitals reviewed.  Constitutional:      Appearance: Normal appearance.  Skin:    General: Skin is warm and dry.     Capillary Refill: Capillary refill takes less than 2 seconds.  Neurological:      General: No focal deficit present.     Mental Status: She is alert and oriented to person, place, and time.  Psychiatric:        Mood and Affect: Mood normal.        Behavior: Behavior normal.     Lab Results  Component Value Date   WBC 9.9 12/20/2018   HGB 10.0 (L) 12/20/2018   HCT 31.4 (L) 12/20/2018   PLT 148 (L) 12/20/2018   GLUCOSE 111 (H) 06/28/2017   ALT 10 06/28/2017   AST 14 06/28/2017   NA 138 06/28/2017   K 3.6 06/28/2017   CL 104 06/28/2017   CREATININE 0.68 06/28/2017   BUN 9 06/28/2017   CO2 26 06/28/2017   TSH 2.76 06/28/2017      Assessment & Plan:    1. Migraine without aura, not intractable, without status migrainosus -continue Excedrin and magnesium as directed  2. Need for immunization against influenza - Flu Vaccine QUAD 56mo+IM (Fluarix, Fluzone & Alfiuria Quad PF)   Follow-up: PRN  An After Visit Summary was printed and given to the patient.  5mo, NP [facility] [phone]

## 2021-01-10 ENCOUNTER — Encounter: Payer: Self-pay | Admitting: Nurse Practitioner

## 2021-01-17 DIAGNOSIS — F4323 Adjustment disorder with mixed anxiety and depressed mood: Secondary | ICD-10-CM | POA: Diagnosis not present

## 2021-01-20 ENCOUNTER — Ambulatory Visit (INDEPENDENT_AMBULATORY_CARE_PROVIDER_SITE_OTHER): Payer: 59 | Admitting: Nurse Practitioner

## 2021-01-20 ENCOUNTER — Other Ambulatory Visit: Payer: Self-pay

## 2021-01-20 ENCOUNTER — Other Ambulatory Visit (HOSPITAL_COMMUNITY): Payer: Self-pay

## 2021-01-20 ENCOUNTER — Telehealth: Payer: Self-pay

## 2021-01-20 ENCOUNTER — Encounter: Payer: Self-pay | Admitting: Nurse Practitioner

## 2021-01-20 VITALS — BP 118/64 | HR 66 | Temp 98.3°F | Resp 18 | Ht 61.0 in | Wt 135.0 lb

## 2021-01-20 DIAGNOSIS — F411 Generalized anxiety disorder: Secondary | ICD-10-CM

## 2021-01-20 DIAGNOSIS — G43009 Migraine without aura, not intractable, without status migrainosus: Secondary | ICD-10-CM

## 2021-01-20 DIAGNOSIS — Z0000 Encounter for general adult medical examination without abnormal findings: Secondary | ICD-10-CM | POA: Diagnosis not present

## 2021-01-20 MED ORDER — NURTEC 75 MG PO TBDP: 75.0000 mg | ORAL_TABLET | Freq: Every day | ORAL | 0 refills | Status: DC | PRN

## 2021-01-20 MED ORDER — NURTEC 75 MG PO TBDP
75.0000 mg | ORAL_TABLET | Freq: Once | ORAL | 2 refills | Status: DC | PRN
  Filled 2021-01-20 – 2021-06-14 (×3): qty 10, 30d supply, fill #0
  Filled 2021-06-23: qty 8, 15d supply, fill #0

## 2021-01-20 MED ORDER — HYDROXYZINE PAMOATE 25 MG PO CAPS
25.0000 mg | ORAL_CAPSULE | Freq: Four times a day (QID) | ORAL | 1 refills | Status: DC | PRN
  Filled 2021-01-20: qty 90, 23d supply, fill #0

## 2021-01-20 NOTE — Progress Notes (Signed)
Subjective:  Patient ID: Natalie Marks, female    DOB: October 27, 1990  Age: 30 y.o. MRN: [id number]  Chief Complaint  Patient presents with   Annual Exam    HPI  Lisabeth is a 30 year old Caucasian female that presents for yearly physical exam. She is followed by [facility] that covers pap smears.  Well Adult Physical: Patient here for a comprehensive physical exam.The patient reports problems - chronic migraines  and intermittent anxiety. Do you take any herbs or supplements that were not prescribed by a doctor? yes Are you taking calcium supplements? no Are you taking aspirin daily? no  Encounter for general adult medical examination without abnormal findings  Physical ("At Risk" items are starred): Patient's last physical exam was 1 year ago .  Patient is not afflicted from Stress Incontinence and Urge Incontinence  Patient wears a seat belt, has smoke detectors, has carbon monoxide detectors, practices appropriate gun safety, and wears sunscreen with extended sun exposure. Dental Care: biannual cleanings, brushes and flosses daily. Ophthalmology/Optometry: Annual visit.  Hearing loss: none Vision impairments: eyeglasses  Menarche: 14 Menstrual History: irregular LMP: 01/01/21 Pregnancy history: G3P3 Safe at home: yes Self breast exams: no  Flowsheet Row Office Visit from 01/06/2021 in [facility]  PHQ-2 Total Score 0               Social Hx   Social History   Socioeconomic History   Marital status: Married    Spouse name: Tim   Number of children: 3   Years of education: Not on file   Highest education level: Not on file  Occupational History   Occupation: Psychologist, sport and exercise  Tobacco Use   Smoking status: Never   Smokeless tobacco: Never  Vaping Use   Vaping Use: Never used  Substance and Sexual Activity   Alcohol use: No   Drug use: No   Sexual activity: Yes    Partners: Male    Birth control/protection: I.U.D.  Other Topics Concern   Not on  file  Social History Narrative   Not on file   Social Determinants of Health   Financial Resource Strain: Not on file  Food Insecurity: Not on file  Transportation Needs: Not on file  Physical Activity: Not on file  Stress: Not on file  Social Connections: Not on file   Past Medical History:  Diagnosis Date   Anemia    Generalized anxiety disorder 08/19/2019   Headache(784.0)    Migraine without aura, not intractable, without status migrainosus 08/19/2019   Other urticaria 08/19/2019   Postpartum care following vaginal delivery (4/6) 07/08/2015   Past Surgical History:  Procedure Laterality Date   NO PAST SURGERIES      Family History  Problem Relation Age of Onset   Diabetes Maternal Grandmother    Alcohol abuse Neg Hx    Arthritis Neg Hx    Asthma Neg Hx    Birth defects Neg Hx    Cancer Neg Hx    COPD Neg Hx    Depression Neg Hx    Drug abuse Neg Hx    Early death Neg Hx    Hearing loss Neg Hx    Heart disease Neg Hx    Hyperlipidemia Neg Hx    Hypertension Neg Hx    Kidney disease Neg Hx    Learning disabilities Neg Hx    Mental illness Neg Hx    Mental retardation Neg Hx    Miscarriages / Stillbirths Neg Hx  Stroke Neg Hx    Vision loss Neg Hx     Review of Systems  Constitutional:  Negative for appetite change, fatigue and unexpected weight change.  HENT:  Negative for congestion, ear pain, rhinorrhea, sinus pressure, sinus pain and tinnitus.   Eyes:  Negative for pain.  Respiratory:  Negative for cough and shortness of breath.   Cardiovascular:  Negative for chest pain, palpitations and leg swelling.  Gastrointestinal:  Negative for abdominal pain, constipation, diarrhea, nausea and vomiting.  Endocrine: Negative for cold intolerance, heat intolerance, polydipsia, polyphagia and polyuria.  Genitourinary:  Negative for dysuria, frequency and hematuria.  Musculoskeletal:  Negative for arthralgias, back pain, joint swelling and myalgias.  Skin:  Negative  for rash.  Allergic/Immunologic: Negative for environmental allergies.  Neurological:  Negative for dizziness and headaches.  Hematological:  Negative for adenopathy.  Psychiatric/Behavioral:  Negative for decreased concentration and sleep disturbance. The patient is not nervous/anxious.     Objective:  BP 118/64   Pulse 66   Temp 98.3 F (36.8 C)   Resp 18   Ht 5\' 1"  (1.549 m)   Wt 135 lb (61.2 kg)   LMP  (Within Weeks)   SpO2 98%   Breastfeeding No   BMI 25.51 kg/m    BP/Weight 01/06/2021 08/19/2019 06/09/2019  Systolic BP 118 90 148  Diastolic BP 68 60 84  Wt. (Lbs) 135 140.2 -  BMI 25.51 26.49 -  Some encounter information is confidential and restricted. Go to Review Flowsheets activity to see all data.    Physical Exam Vitals reviewed.  Constitutional:      Appearance: Normal appearance.  HENT:     Head: Normocephalic.     Right Ear: Tympanic membrane normal.     Left Ear: Tympanic membrane normal.     Nose: Nose normal.     Mouth/Throat:     Mouth: Mucous membranes are moist.  Eyes:     Pupils: Pupils are equal, round, and reactive to light.  Cardiovascular:     Rate and Rhythm: Normal rate and regular rhythm.     Pulses: Normal pulses.     Heart sounds: Normal heart sounds.  Pulmonary:     Effort: Pulmonary effort is normal.     Breath sounds: Normal breath sounds.  Abdominal:     General: Bowel sounds are normal.     Palpations: Abdomen is soft.  Musculoskeletal:        General: Normal range of motion.     Cervical back: Neck supple.  Skin:    General: Skin is warm and dry.     Capillary Refill: Capillary refill takes less than 2 seconds.  Neurological:     General: No focal deficit present.     Mental Status: She is alert and oriented to person, place, and time.  Psychiatric:        Mood and Affect: Mood normal.        Behavior: Behavior normal.    Lab Results  Component Value Date   WBC 9.9 12/20/2018   HGB 10.0 (L) 12/20/2018   HCT 31.4 (L)  12/20/2018   PLT 148 (L) 12/20/2018   GLUCOSE 111 (H) 06/28/2017   ALT 10 06/28/2017   AST 14 06/28/2017   NA 138 06/28/2017   K 3.6 06/28/2017   CL 104 06/28/2017   CREATININE 0.68 06/28/2017   BUN 9 06/28/2017   CO2 26 06/28/2017   TSH 2.76 06/28/2017      Assessment & Plan:  1. Encounter for annual physical exam - CBC with Differential - Comprehensive metabolic panel - TSH  2. GAD (generalized anxiety disorder) - hydrOXYzine (VISTARIL) 25 MG capsule; Take 1 capsule (25 mg total) by mouth every 6 (six) hours as needed for anxiety.  Dispense: 90 capsule; Refill: 1 -continue therapy as directed  3. Migraine without aura, not intractable, without status migrainosus - Rimegepant Sulfate (NURTEC) 75 MG TBDP; Take 75 mg by mouth daily as needed (migraine onset).  Dispense: 12 tablet; Refill: 0 - Rimegepant Sulfate (NURTEC) 75 MG TBDP; Take 75 mg by mouth once as needed for up to 1 dose (migraine).  Dispense: 30 tablet; Refill: 2     Try Nurtec 75 mg once as needed for migraine headache Begin Atarax 25 mg as needed for anxiety  Follow-up 23-month    These are the goals we discussed:  Goals   Start back to nursing school      This is a list of the screening recommended for you and due dates:  Health Maintenance  Topic Date Due   COVID-19 Vaccine (3 - Booster for Pfizer series) 02/18/2020   Tetanus Vaccine  10/24/2022   Pap Smear  10/28/2023   Pap Smear  10/28/2023   Flu Shot  Completed   HIV Screening  Completed   Pneumococcal Vaccination  Aged Out   HPV Vaccine  Aged Out   Hepatitis C Screening: USPSTF Recommendation to screen - Ages 18-28 yo.  Discontinued      I,Victoria C Greene,acting as a Neurosurgeon for [employer], NP.,have documented all relevant documentation on the behalf of Janie Morning, NP,as directed by  Janie Morning, NP while in the presence of Janie Morning, NP.  I, Janie Morning, NP, have reviewed all documentation for this visit.  The documentation on 01/20/21 for the exam, diagnosis, procedures, and orders are all accurate and complete.    Follow-up:66-months    Signed, Janie Morning, NP [facility] [phone]

## 2021-01-20 NOTE — Patient Instructions (Signed)
Try Nurtec 75 mg once as needed for migraine headache Begin Atarax 25 mg as needed for anxiety  Follow-up 71-month  Health Maintenance, Female Adopting a healthy lifestyle and getting preventive care are important in promoting health and wellness. Ask your health care provider about: The right schedule for you to have regular tests and exams. Things you can do on your own to prevent diseases and keep yourself healthy. What should I know about diet, weight, and exercise? Eat a healthy diet  Eat a diet that includes plenty of vegetables, fruits, low-fat dairy products, and lean protein. Do not eat a lot of foods that are high in solid fats, added sugars, or sodium. Maintain a healthy weight Body mass index (BMI) is used to identify weight problems. It estimates body fat based on height and weight. Your health care provider can help determine your BMI and help you achieve or maintain a healthy weight. Get regular exercise Get regular exercise. This is one of the most important things you can do for your health. Most adults should: Exercise for at least 150 minutes each week. The exercise should increase your heart rate and make you sweat (moderate-intensity exercise). Do strengthening exercises at least twice a week. This is in addition to the moderate-intensity exercise. Spend less time sitting. Even light physical activity can be beneficial. Watch cholesterol and blood lipids Have your blood tested for lipids and cholesterol at 28 years of age, then have this test every 5 years. Have your cholesterol levels checked more often if: Your lipid or cholesterol levels are high. You are older than 28 years of age. You are at high risk for heart disease. What should I know about cancer screening? Depending on your health history and family history, you may need to have cancer screening at various ages. This may include screening for: Breast cancer. Cervical cancer. Colorectal cancer. Skin  cancer. Lung cancer. What should I know about heart disease, diabetes, and high blood pressure? Blood pressure and heart disease High blood pressure causes heart disease and increases the risk of stroke. This is more likely to develop in people who have high blood pressure readings, are of African descent, or are overweight. Have your blood pressure checked: Every 3-5 years if you are 62-9 years of age. Every year if you are 41 years old or older. Diabetes Have regular diabetes screenings. This checks your fasting blood sugar level. Have the screening done: Once every three years after age 70 if you are at a normal weight and have a low risk for diabetes. More often and at a younger age if you are overweight or have a high risk for diabetes. What should I know about preventing infection? Hepatitis B If you have a higher risk for hepatitis B, you should be screened for this virus. Talk with your health care provider to find out if you are at risk for hepatitis B infection. Hepatitis C Testing is recommended for: Everyone born from [birth year] through 1963. Anyone with known risk factors for hepatitis C. Sexually transmitted infections (STIs) Get screened for STIs, including gonorrhea and chlamydia, if: You are sexually active and are younger than 28 years of age. You are older than 28 years of age and your health care provider tells you that you are at risk for this type of infection. Your sexual activity has changed since you were last screened, and you are at increased risk for chlamydia or gonorrhea. Ask your health care provider if you are at risk. Ask  your health care provider about whether you are at high risk for HIV. Your health care provider may recommend a prescription medicine to help prevent HIV infection. If you choose to take medicine to prevent HIV, you should first get tested for HIV. You should then be tested every 3 months for as long as you are taking the medicine. Pregnancy If  you are about to stop having your period (premenopausal) and you may become pregnant, seek counseling before you get pregnant. Take 400 to 800 micrograms (mcg) of folic acid every day if you become pregnant. Ask for birth control (contraception) if you want to prevent pregnancy. Osteoporosis and menopause Osteoporosis is a disease in which the bones lose minerals and strength with aging. This can result in bone fractures. If you are 52 years old or older, or if you are at risk for osteoporosis and fractures, ask your health care provider if you should: Be screened for bone loss. Take a calcium or vitamin D supplement to lower your risk of fractures. Be given hormone replacement therapy (HRT) to treat symptoms of menopause. Follow these instructions at home: Lifestyle Do not use any products that contain nicotine or tobacco, such as cigarettes, e-cigarettes, and chewing tobacco. If you need help quitting, ask your health care provider. Do not use street drugs. Do not share needles. Ask your health care provider for help if you need support or information about quitting drugs. Alcohol use Do not drink alcohol if: Your health care provider tells you not to drink. You are pregnant, may be pregnant, or are planning to become pregnant. If you drink alcohol: Limit how much you use to 0-1 drink a day. Limit intake if you are breastfeeding. Be aware of how much alcohol is in your drink. In the [place] one drink equals one 12 oz bottle of beer (355 mL), one 5 oz glass of wine (148 mL), or one 1 oz glass of hard liquor (44 mL). General instructions Schedule regular health, dental, and eye exams. Stay current with your vaccines. Tell your health care provider if: You often feel depressed. You have ever been abused or do not feel safe at home. Summary Adopting a healthy lifestyle and getting preventive care are important in promoting health and wellness. Follow your health care provider's  instructions about healthy diet, exercising, and getting tested or screened for diseases. Follow your health care provider's instructions on monitoring your cholesterol and blood pressure. This information is not intended to replace advice given to you by your health care provider. Make sure you discuss any questions you have with your health care provider. Document Revised: 05/28/2020 Document Reviewed: 03/13/2018 Elsevier Patient Education  2022 Else[employer] Inc. Managing Anxiety, Adult After being diagnosed with an anxiety disorder, you may be relieved to know why you have felt or behaved a certain way. You may also feel overwhelmed about the treatment ahead and what it will mean for your life. With care and support, you can manage this condition and recover from it. How to manage lifestyle changes Managing stress and anxiety Stress is your body's reaction to life changes and events, both good and bad. Most stress will last just a few hours, but stress can be ongoing and can lead to more than just stress. Although stress can play a major role in anxiety, it is not the same as anxiety. Stress is usually caused by something external, such as a deadline, test, or competition. Stress normally passes after the triggering event has ended.  Anxiety  is caused by something internal, such as imagining a terrible outcome or worrying that something will go wrong that will devastate you. Anxiety often does not go away even after the triggering event is over, and it can become long-term (chronic) worry. It is important to understand the differences between stress and anxiety and to manage your stress effectively so that it does not lead to an anxious response. Talk with your health care provider or a counselor to learn more about reducing anxiety and stress. He or she may suggest tension reduction techniques, such as: Music therapy. This can include creating or listening to music that you enjoy and that inspires  you. Mindfulness-based meditation. This involves being aware of your normal breaths while not trying to control your breathing. It can be done while sitting or walking. Centering prayer. This involves focusing on a word, phrase, or sacred image that means something to you and brings you peace. Deep breathing. To do this, expand your stomach and inhale slowly through your nose. Hold your breath for 3-5 seconds. Then exhale slowly, letting your stomach muscles relax. Self-talk. This involves identifying thought patterns that lead to anxiety reactions and changing those patterns. Muscle relaxation. This involves tensing muscles and then relaxing them. Choose a tension reduction technique that suits your lifestyle and personality. These techniques take time and practice. Set aside 5-15 minutes a day to do them. Therapists can offer counseling and training in these techniques. The training to help with anxiety may be covered by some insurance plans. Other things you can do to manage stress and anxiety include: Keeping a stress/anxiety diary. This can help you learn what triggers your reaction and then learn ways to manage your response. Thinking about how you react to certain situations. You may not be able to control everything, but you can control your response. Making time for activities that help you relax and not feeling guilty about spending your time in this way. Visual imagery and yoga can help you stay calm and relax.  Medicines Medicines can help ease symptoms. Medicines for anxiety include: Anti-anxiety drugs. Antidepressants. Medicines are often used as a primary treatment for anxiety disorder. Medicines will be prescribed by a health care provider. When used together, medicines, psychotherapy, and tension reduction techniques may be the most effective treatment. Relationships Relationships can play a big part in helping you recover. Try to spend more time connecting with trusted friends and  family members. Consider going to couples counseling, taking family education classes, or going to family therapy. Therapy can help you and others better understand your condition. How to recognize changes in your anxiety Everyone responds differently to treatment for anxiety. Recovery from anxiety happens when symptoms decrease and stop interfering with your daily activities at home or work. This may mean that you will start to: Have better concentration and focus. Worry will interfere less in your daily thinking. Sleep better. Be less irritable. Have more energy. Have improved memory. It is important to recognize when your condition is getting worse. Contact your health care provider if your symptoms interfere with home or work and you feel like your condition is not improving. Follow these instructions at home: Activity Exercise. Most adults should do the following: Exercise for at least 150 minutes each week. The exercise should increase your heart rate and make you sweat (moderate-intensity exercise). Strengthening exercises at least twice a week. Get the right amount and quality of sleep. Most adults need 7-9 hours of sleep each night. Lifestyle  Eat  a healthy diet that includes plenty of vegetables, fruits, whole grains, low-fat dairy products, and lean protein. Do not eat a lot of foods that are high in solid fats, added sugars, or salt. Make choices that simplify your life. Do not use any products that contain nicotine or tobacco, such as cigarettes, e-cigarettes, and chewing tobacco. If you need help quitting, ask your health care provider. Avoid caffeine, alcohol, and certain over-the-counter cold medicines. These may make you feel worse. Ask your pharmacist which medicines to avoid. General instructions Take over-the-counter and prescription medicines only as told by your health care provider. Keep all follow-up visits as told by your health care provider. This is important. Where  to find support You can get help and support from these sources: Self-help groups. Online and [employer]. A trusted spiritual leader. Couples counseling. Family education classes. Family therapy. Where to find more information You may find that joining a support group helps you deal with your anxiety. The following sources can help you locate counselors or support groups near you: Mental Health America: www.mentalhealthamerica.net Anxiety and Depression Association of [place] (ADAA): ProgramCam.de [employer] on Mental Illness (NAMI): www.nami.org Contact a health care provider if you: Have a hard time staying focused or finishing daily tasks. Spend many hours a day feeling worried about everyday life. Become exhausted by worry. Start to have headaches, feel tense, or have nausea. Urinate more than normal. Have diarrhea. Get help right away if you have: A racing heart and shortness of breath. Thoughts of hurting yourself or others. If you ever feel like you may hurt yourself or others, or have thoughts about taking your own life, get help right away. You can go to your nearest emergency department or call: Your local emergency services (911 in the [place] A suicide crisis helpline, such as the National Suicide Prevention Lifeline at (304) 617-4812. This is open 24 hours a day. Summary Taking steps to learn and use tension reduction techniques can help calm you and help prevent triggering an anxiety reaction. When used together, medicines, psychotherapy, and tension reduction techniques may be the most effective treatment. Family, friends, and partners can play a big part in helping you recover from an anxiety disorder. This information is not intended to replace advice given to you by your health care provider. Make sure you discuss any questions you have with your health care provider. Document Revised: 08/20/2018 Document Reviewed: 08/20/2018 Elsevier Patient  Education  2022 Elsevier Inc. Chronic Migraine Headache A migraine headache is throbbing pain that is usually on one side of the head. Migraines that keep coming back are called recurring migraines. A migraine is called a chronic migraine if it happens at least 15 days in a month for more than 3 months. Talk with your doctor about what things may bring on (trigger) your migraines. What are the causes? The exact cause of this condition is not known. A migraine may be caused when nerves in the brain become irritated and release chemicals that cause irritation and swelling (inflammation) of blood vessels. The irritation and swelling of the blood vessels causes pain. Migraines may be brought on or caused by: Smoking. Foods and drinks, such as: Cheese. Chocolate. Alcohol. Caffeine. Certain substances in some foods or drinks. Some medicines. Other things that may bring on a migraine include: Periods, for women. Stress. Not enough sleep or too much sleep. Feeling very tired. Bright lights or loud noises. Smells Weather changes and being at high altitude. What increases the risk? The following  factors may make you more likely to have chronic migraine: Having migraines or family members who have them. Being very sad (depressed) or feeling worried or nervous (anxious). Taking a lot of pain medicine. Having problems sleeping. Having heart disease, diabetes, or being very overweight (obese). What are the signs or symptoms? Symptoms of this condition include: Pain that feels like it throbs. Pain that is usually only on one side of the head. In some cases, the pain may be on both sides of the head or around the head or neck. Very bad pain that keeps you from doing daily activities. Pain that gets worse with activity. Feeling like you may vomit (feeling nauseous) or vomiting. Pain when you are around bright lights, loud noises, or activity. Being sensitive to bright lights, loud noises, or  smells. Feeling dizzy. How is this treated? This condition is treated with: Medicines. These help to: Lessen pain and the feeling like you may vomit. Prevent migraines. Changes to your diet or sleep. Therapy. This might include: Relaxation training. Biofeedback. This is a treatment that teaches you to relax, use your brain to lower your heart rate, and control your breathing. Cognitive behavioral therapy (CBT). This therapy helps you set goals and follow up on the changes that you make. Acupuncture. Using a device that provides electrical stimulation to your nerves, which can help take away pain. Surgery, if the other treatments do not work. Follow these instructions at home: Medicines Take over-the-counter and prescription medicines only as told by your doctor. Ask your doctor if the medicine prescribed to you requires you to avoid driving or using machinery. Lifestyle  Do not use any products that contain nicotine or tobacco, such as cigarettes, e-cigarettes, and chewing tobacco. If you need help quitting, ask your doctor. Do not drink alcohol. Get 7-9 hours of sleep each night. Lower the stress in your life. Ask your doctor about ways to do this. Stay at a healthy weight. Talk with your doctor if you need help losing weight. Get regular exercise. General instructions  Keep a journal to find out if certain things bring on migraines. For example, write down: What you eat and drink. How much sleep you get. Any change to your diet or medicines. Lie down in a dark, quiet room when you have a migraine. Try placing a cool towel over your head when you have a migraine. Keep lights dim if bright lights bother you or make your migraines worse. Keep all follow-up visits as told by your doctor. This is important. Where to find more information Coalition for Headache and Migraine Patients (CHAMP): headachemigraine.org American Migraine Foundation: americanmigrainefoundation.org National  Headache Foundation: headaches.org Contact a doctor if: Medicine does not help your migraine. Your pain keeps coming back. Get help right away if: Your migraine becomes really bad and medicine does not help. You have a stiff neck and fever. You have trouble seeing. Your muscles are weak or you lose control of them. You lose your balance or have trouble walking. You feel like you will faint or you faint. You start having sudden, very bad headaches. You have a seizure. Summary A migraine headache is very bad, throbbing pain that is usually on one side of the head. A chronic migraine is a migraine that happens 15 days in a month for more than 3 months. Talk with your doctor about what things may bring on your migraines. Lie down in a dark, quiet room when you have a migraine. Keep a journal. This can help  you find out if certain things make you have migraines. This information is not intended to replace advice given to you by your health care provider. Make sure you discuss any questions you have with your health care provider. Document Revised: 05/07/2019 Document Reviewed: 05/07/2019 Elsevier Patient Education  2022 [employer].

## 2021-01-20 NOTE — Telephone Encounter (Signed)
PA for nurtec submitted and approved via covermymeds.

## 2021-01-21 ENCOUNTER — Other Ambulatory Visit (HOSPITAL_COMMUNITY): Payer: Self-pay

## 2021-01-21 LAB — CBC WITH DIFFERENTIAL/PLATELET
Basophils Absolute: 0.1 10*3/uL (ref 0.0–0.2)
Basos: 1 %
EOS (ABSOLUTE): 0.1 10*3/uL (ref 0.0–0.4)
Eos: 1 %
Hematocrit: 39.6 % (ref 34.0–46.6)
Hemoglobin: 13.2 g/dL (ref 11.1–15.9)
Immature Grans (Abs): 0 10*3/uL (ref 0.0–0.1)
Immature Granulocytes: 0 %
Lymphocytes Absolute: 1.4 10*3/uL (ref 0.7–3.1)
Lymphs: 25 %
MCH: 29.7 pg (ref 26.6–33.0)
MCHC: 33.3 g/dL (ref 31.5–35.7)
MCV: 89 fL (ref 79–97)
Monocytes Absolute: 0.5 10*3/uL (ref 0.1–0.9)
Monocytes: 8 %
Neutrophils Absolute: 3.6 10*3/uL (ref 1.4–7.0)
Neutrophils: 65 %
Platelets: 249 10*3/uL (ref 150–450)
RBC: 4.44 x10E6/uL (ref 3.77–5.28)
RDW: 12.9 % (ref 11.7–15.4)
WBC: 5.6 10*3/uL (ref 3.4–10.8)

## 2021-01-21 LAB — COMPREHENSIVE METABOLIC PANEL
ALT: 13 IU/L (ref 0–32)
AST: 18 IU/L (ref 0–40)
Albumin/Globulin Ratio: 1.6 (ref 1.2–2.2)
Albumin: 4.4 g/dL (ref 3.9–5.0)
Alkaline Phosphatase: 50 IU/L (ref 44–121)
BUN/Creatinine Ratio: 13 (ref 9–23)
BUN: 9 mg/dL (ref 6–20)
Bilirubin Total: 0.4 mg/dL (ref 0.0–1.2)
CO2: 21 mmol/L (ref 20–29)
Calcium: 9.4 mg/dL (ref 8.7–10.2)
Chloride: 103 mmol/L (ref 96–106)
Creatinine, Ser: 0.67 mg/dL (ref 0.57–1.00)
Globulin, Total: 2.7 g/dL (ref 1.5–4.5)
Glucose: 88 mg/dL (ref 70–99)
Potassium: 4.3 mmol/L (ref 3.5–5.2)
Sodium: 139 mmol/L (ref 134–144)
Total Protein: 7.1 g/dL (ref 6.0–8.5)
eGFR: 123 mL/min/{1.73_m2} (ref >59)

## 2021-01-21 LAB — TSH: TSH: 1.45 u[IU]/mL (ref 0.450–4.500)

## 2021-01-24 ENCOUNTER — Other Ambulatory Visit (HOSPITAL_COMMUNITY): Payer: Self-pay

## 2021-01-25 ENCOUNTER — Other Ambulatory Visit: Payer: Self-pay

## 2021-01-25 ENCOUNTER — Other Ambulatory Visit: Payer: Self-pay | Admitting: Nurse Practitioner

## 2021-01-25 ENCOUNTER — Other Ambulatory Visit (HOSPITAL_COMMUNITY): Payer: Self-pay

## 2021-01-25 ENCOUNTER — Other Ambulatory Visit: Payer: 59

## 2021-01-25 DIAGNOSIS — Z020 Encounter for examination for admission to educational institution: Secondary | ICD-10-CM | POA: Diagnosis not present

## 2021-02-01 ENCOUNTER — Ambulatory Visit: Payer: 59

## 2021-02-01 DIAGNOSIS — Z0100 Encounter for examination of eyes and vision without abnormal findings: Secondary | ICD-10-CM

## 2021-02-01 DIAGNOSIS — Z0110 Encounter for examination of ears and hearing without abnormal findings: Secondary | ICD-10-CM

## 2021-02-01 NOTE — Progress Notes (Signed)
Pt was in office for nurse visit to obtain hearing and vision screenings for school.   Lorita Officer, CCMA 02/01/21 9:30 AM

## 2021-02-02 ENCOUNTER — Other Ambulatory Visit (HOSPITAL_COMMUNITY): Payer: Self-pay

## 2021-02-02 DIAGNOSIS — F4323 Adjustment disorder with mixed anxiety and depressed mood: Secondary | ICD-10-CM | POA: Diagnosis not present

## 2021-02-02 LAB — QUANTIFERON-TB GOLD PLUS
QuantiFERON Mitogen Value: >10 IU/mL
QuantiFERON Nil Value: 0.07 IU/mL
QuantiFERON TB1 Ag Value: 0.09 IU/mL
QuantiFERON TB2 Ag Value: 0.08 IU/mL
QuantiFERON-TB Gold Plus: NEGATIVE

## 2021-02-14 ENCOUNTER — Other Ambulatory Visit (HOSPITAL_COMMUNITY): Payer: Self-pay

## 2021-02-14 DIAGNOSIS — F4323 Adjustment disorder with mixed anxiety and depressed mood: Secondary | ICD-10-CM | POA: Diagnosis not present

## 2021-02-22 ENCOUNTER — Other Ambulatory Visit (HOSPITAL_COMMUNITY): Payer: Self-pay

## 2021-03-03 DIAGNOSIS — F4323 Adjustment disorder with mixed anxiety and depressed mood: Secondary | ICD-10-CM | POA: Diagnosis not present

## 2021-03-10 DIAGNOSIS — F4323 Adjustment disorder with mixed anxiety and depressed mood: Secondary | ICD-10-CM | POA: Diagnosis not present

## 2021-03-16 DIAGNOSIS — F4323 Adjustment disorder with mixed anxiety and depressed mood: Secondary | ICD-10-CM | POA: Diagnosis not present

## 2021-03-30 DIAGNOSIS — F4323 Adjustment disorder with mixed anxiety and depressed mood: Secondary | ICD-10-CM | POA: Diagnosis not present

## 2021-04-25 ENCOUNTER — Ambulatory Visit: Payer: 59 | Admitting: Nurse Practitioner

## 2021-04-25 DIAGNOSIS — F4323 Adjustment disorder with mixed anxiety and depressed mood: Secondary | ICD-10-CM | POA: Diagnosis not present

## 2021-05-18 DIAGNOSIS — F4323 Adjustment disorder with mixed anxiety and depressed mood: Secondary | ICD-10-CM | POA: Diagnosis not present

## 2021-05-30 DIAGNOSIS — F4323 Adjustment disorder with mixed anxiety and depressed mood: Secondary | ICD-10-CM | POA: Diagnosis not present

## 2021-06-13 DIAGNOSIS — F4323 Adjustment disorder with mixed anxiety and depressed mood: Secondary | ICD-10-CM | POA: Diagnosis not present

## 2021-06-14 ENCOUNTER — Other Ambulatory Visit: Payer: Self-pay

## 2021-06-14 ENCOUNTER — Other Ambulatory Visit (HOSPITAL_COMMUNITY): Payer: Self-pay

## 2021-06-14 ENCOUNTER — Encounter: Payer: Self-pay | Admitting: Nurse Practitioner

## 2021-06-14 ENCOUNTER — Ambulatory Visit: Payer: 59 | Admitting: Nurse Practitioner

## 2021-06-14 VITALS — BP 104/80 | HR 73 | Temp 97.4°F | Ht 61.0 in | Wt 133.0 lb

## 2021-06-14 DIAGNOSIS — F411 Generalized anxiety disorder: Secondary | ICD-10-CM

## 2021-06-14 DIAGNOSIS — H6123 Impacted cerumen, bilateral: Secondary | ICD-10-CM

## 2021-06-14 DIAGNOSIS — G43009 Migraine without aura, not intractable, without status migrainosus: Secondary | ICD-10-CM

## 2021-06-14 MED ORDER — BUSPIRONE HCL 5 MG PO TABS
5.0000 mg | ORAL_TABLET | Freq: Two times a day (BID) | ORAL | 0 refills | Status: DC
  Filled 2021-06-14: qty 60, 30d supply, fill #0

## 2021-06-14 NOTE — Progress Notes (Signed)
? ?Subjective:  ?Patient ID: Natalie Marks, female    DOB: December 26, 1990  Age: 31 y.o. MRN: [id number] ? ?Chief Complaint  ?Patient presents with  ? Anxiety  ? Headache  ? ? ?HPI ?Natalie Marks is a 31 year old Caucasian female that presents for follow-up of anxiety and chronic migraines. She is a full-time Theatre stage manager, works full-time, and is raising a family. She is followed closely by [facility]. Previous prescribed medications for anxiety and depression include Paxil, Wellbutrin, and Klonopin.  ? ?Anxiety, Follow-up ? ?She was last seen for anxiety 6 months ago. ?Current treatment includes therapy at [facility] every other week and Hydroxyzine PRN ?She reports excellent compliance with treatment. ?She reports excellent tolerance of treatment. ?She is not having side effects.  ? ?She feels her anxiety is moderate and Unchanged since last visit. ? ?Symptoms: ?No chest pain Yes difficulty concentrating  ?No dizziness Yes fatigue  ?No feelings of losing control Yes insomnia  ?No irritable No palpitations  ?No panic attacks Yes racing thoughts  ?No shortness of breath No sweating  ?No tremors/shakes   ? ?GAD-7 Results ?GAD-7 Generalized Anxiety Disorder Screening Tool 06/14/2021 01/20/2021  ?1. Feeling Nervous, Anxious, or on Edge 2 1  ?2. Not Being Able to Stop or Control Worrying 2 1  ?3. Worrying Too Much About Different Things 2 2  ?4. Trouble Relaxing 3 2  ?5. Being So Restless it's Hard To Sit Still 0 2  ?6. Becoming Easily Annoyed or Irritable 1 2  ?7. Feeling Afraid As If Something Awful Might Happen 0 2  ?Total GAD-7 Score 10 12  ?Difficulty At Work, Home, or Getting  Along With Others? Somewhat difficult Somewhat difficult  ? ? ?PHQ-9 Scores ?PHQ9 SCORE ONLY 01/06/2021 08/19/2019  ?PHQ-9 Total Score 0 0  ?  ?Migraine, follow-up: ?Natalie Marks has a past history of migraine headaches for several years. Current treatment includes Nurtec PRN. Denies side effects of treatment. States the number and severity of  migraines has decreased since beginning Nurtec.  ? ?Current Outpatient Medications on File Prior to Visit  ?Medication Sig Dispense Refill  ? folic acid (FOLVITE) 1 MG tablet Take 1 tablet by mouth once a day. 90 tablet 3  ? hydrOXYzine (VISTARIL) 25 MG capsule Take 1 capsule (25 mg total) by mouth every 6 (six) hours as needed for anxiety. 90 capsule 1  ? Rimegepant Sulfate (NURTEC) 75 MG TBDP Dissolve one tablet (75 mg) by mouth once for up to 1 dose as needed for migraines. 30 tablet 2  ? ?No current facility-administered medications on file prior to visit.  ? ?Past Medical History:  ?Diagnosis Date  ? Anemia   ? Generalized anxiety disorder 08/19/2019  ? Headache(784.0)   ? Migraine without aura, not intractable, without status migrainosus 08/19/2019  ? Other urticaria 08/19/2019  ? Postpartum care following vaginal delivery (4/6) 07/08/2015  ? ?Past Surgical History:  ?Procedure Laterality Date  ? NO PAST SURGERIES    ?  ?Family History  ?Problem Relation Age of Onset  ? Diabetes Maternal Grandmother   ? Alcohol abuse Neg Hx   ? Arthritis Neg Hx   ? Asthma Neg Hx   ? Birth defects Neg Hx   ? Cancer Neg Hx   ? COPD Neg Hx   ? Depression Neg Hx   ? Drug abuse Neg Hx   ? Early death Neg Hx   ? Hearing loss Neg Hx   ? Heart disease Neg Hx   ? Hyperlipidemia Neg Hx   ?  Hypertension Neg Hx   ? Kidney disease Neg Hx   ? Learning disabilities Neg Hx   ? Mental illness Neg Hx   ? Mental retardation Neg Hx   ? Miscarriages / Stillbirths Neg Hx   ? Stroke Neg Hx   ? Vision loss Neg Hx   ? ?Social History  ? ?Socioeconomic History  ? Marital status: Married  ?  Spouse name: [name]  ? Number of children: 3  ? Years of education: Not on file  ? Highest education level: Not on file  ?Occupational History  ? Occupation: Psychologist, sport and exercise  ?Tobacco Use  ? Smoking status: Never  ? Smokeless tobacco: Never  ?Vaping Use  ? Vaping Use: Never used  ?Substance and Sexual Activity  ? Alcohol use: No  ? Drug use: No  ? Sexual activity: Yes  ?   Partners: Male  ?  Birth control/protection: I.U.D.  ?Other Topics Concern  ? Not on file  ?Social History Narrative  ? Not on file  ? ?Social Determinants of Health  ? ?Financial Resource Strain: Not on file  ?Food Insecurity: Not on file  ?Transportation Needs: Not on file  ?Physical Activity: Not on file  ?Stress: Not on file  ?Social Connections: Not on file  ? ? ?Review of Systems  ?Constitutional:  Negative for chills, fatigue and fever.  ?HENT:  Negative for congestion, ear pain, rhinorrhea and sore throat.   ?Respiratory:  Negative for cough and shortness of breath.   ?Cardiovascular:  Negative for chest pain.  ?Gastrointestinal:  Negative for abdominal pain, constipation, diarrhea, nausea and vomiting.  ?Genitourinary:  Negative for dysuria and urgency.  ?Musculoskeletal:  Negative for back pain and myalgias.  ?Neurological:  Negative for dizziness, weakness, light-headedness and headaches.  ?Psychiatric/Behavioral:  Negative for dysphoric mood. The patient is not nervous/anxious.   ? ? ?Objective:  ?BP 104/80   Pulse 73   Temp (!) 97.4 ?F (36.3 ?C)   Ht 5\' 1"  (1.549 m)   Wt 133 lb (60.3 kg)   SpO2 99%   BMI 25.13 kg/m?   ? ?BP/Weight 06/14/2021 01/20/2021 01/06/2021  ?Systolic BP - 118 118  ?Diastolic BP - 64 68  ?Wt. (Lbs) 133 135 135  ?BMI 25.13 25.51 25.51  ?Some encounter information is confidential and restricted. Go to Review Flowsheets activity to see all data.  ? ? ?Physical Exam ?Vitals reviewed.  ?Constitutional:   ?   Appearance: Normal appearance.  ?HENT:  ?   Head: Normocephalic.  ?   Right Ear: There is impacted cerumen.  ?   Left Ear: There is impacted cerumen.  ?   Nose: Nose normal.  ?   Mouth/Throat:  ?   Mouth: Mucous membranes are moist.  ?Eyes:  ?   Pupils: Pupils are equal, round, and reactive to light.  ?Cardiovascular:  ?   Rate and Rhythm: Normal rate and regular rhythm.  ?   Pulses: Normal pulses.  ?   Heart sounds: Normal heart sounds.  ?Pulmonary:  ?   Effort: Pulmonary  effort is normal.  ?   Breath sounds: Normal breath sounds.  ?Abdominal:  ?   General: Bowel sounds are normal.  ?   Palpations: Abdomen is soft.  ?Musculoskeletal:     ?   General: Normal range of motion.  ?   Cervical back: Neck supple.  ?Skin: ?   General: Skin is warm and dry.  ?   Capillary Refill: Capillary refill takes less than 2  seconds.  ?Neurological:  ?   General: No focal deficit present.  ?   Mental Status: She is alert and oriented to person, place, and time.  ?Psychiatric:     ?   Mood and Affect: Mood normal.     ?   Behavior: Behavior normal.  ? ? ?  ? ?Lab Results  ?Component Value Date  ? WBC 5.6 01/20/2021  ? HGB 13.2 01/20/2021  ? HCT 39.6 01/20/2021  ? PLT 249 01/20/2021  ? GLUCOSE 88 01/20/2021  ? ALT 13 01/20/2021  ? AST 18 01/20/2021  ? NA 139 01/20/2021  ? K 4.3 01/20/2021  ? CL 103 01/20/2021  ? CREATININE 0.67 01/20/2021  ? BUN 9 01/20/2021  ? CO2 21 01/20/2021  ? TSH 1.450 01/20/2021  ? ? ? ? ?Assessment & Plan:  ? ?1. Migraine without aura, not intractable, without status migrainosus- well controlled ?-continue Nurtec 75 mg PRN ? ?2. GAD (generalized anxiety disorder)-not at goal ?- busPIRone (BUSPAR) 5 MG tablet; Take 1 tablet (5 mg total) by mouth 2 (two) times daily.  Dispense: 60 tablet; Refill: 0 ? ?3. Impacted cerumen of both ears ?- Ear wax removal ?  ? ?Discontinue Hydroxyzine  ?Begin Buspar 5 mg twice daily ?Notify office of any adverse side effects ?Continue therapy as scheduled ?Use Debrox drops to bilateral ears to soften ear wax ?Follow-up in 4-weeks ? ? ?  ? ?Follow-up: 4-weeks ? ?An After Visit Summary was printed and given to the patient. ? ?I, Janie MorningShannon J Chijioke Lasser, NP, have reviewed all documentation for this visit. The documentation on 06/14/21 for the exam, diagnosis, procedures, and orders are all accurate and complete.  ? ?Signed, ?Janie MorningShannon J Stepfon Rawles, NP ?[facility] ?[phone] ?

## 2021-06-14 NOTE — Patient Instructions (Addendum)
Discontinue Hydroxyzine  ?Begin Buspar 5 mg twice daily ?Notify office of any adverse side effects ?Continue therapy as scheduled ?Use Debrox drops to bilateral ears to soften ear wax ?Follow-up in 4-weeks ? ?Earwax Buildup, Adult ?The ears produce a substance called earwax that helps keep bacteria out of the ear and protects the skin in the ear canal. Occasionally, earwax can build up in the ear and cause discomfort or hearing loss. ?What are the causes? ?This condition is caused by a buildup of earwax. Ear canals are self-cleaning. Ear wax is made in the outer part of the ear canal and generally falls out in small amounts over time. ?When the self-cleaning mechanism is not working, earwax builds up and can cause decreased hearing and discomfort. Attempting to clean ears with cotton swabs can push the earwax deep into the ear canal and cause decreased hearing and pain. ?What increases the risk? ?This condition is more likely to develop in people who: ?Clean their ears often with cotton swabs. ?Pick at their ears. ?Use earplugs or in-ear headphones often, or wear hearing aids. ?The following factors may also make you more likely to develop this condition: ?Being female. ?Being of older age. ?Naturally producing more earwax. ?Having narrow ear canals. ?Having earwax that is overly thick or sticky. ?Having excess hair in the ear canal. ?Having eczema. ?Being dehydrated. ?What are the signs or symptoms? ?Symptoms of this condition include: ?Reduced or muffled hearing. ?A feeling of fullness in the ear or feeling that the ear is plugged. ?Fluid coming from the ear. ?Ear pain or an itchy ear. ?Ringing in the ear. ?Coughing. ?Balance problems. ?An obvious piece of earwax that can be seen inside the ear canal. ?How is this diagnosed? ?This condition may be diagnosed based on: ?Your symptoms. ?Your medical history. ?An ear exam. During the exam, your health care provider will look into your ear with an instrument called an  otoscope. ?You may have tests, including a hearing test. ?How is this treated? ?This condition may be treated by: ?Using ear drops to soften the earwax. ?Having the earwax removed by a health care provider. The health care provider may: ?Flush the ear with water. ?Use an instrument that has a loop on the end (curette). ?Use a suction device. ?Having surgery to remove the wax buildup. This may be done in severe cases. ?Follow these instructions at home: ? ?Take over-the-counter and prescription medicines only as told by your health care provider. ?Do not put any objects, including cotton swabs, into your ear. You can clean the opening of your ear canal with a washcloth or facial tissue. ?Follow instructions from your health care provider about cleaning your ears. Do not overclean your ears. ?Drink enough fluid to keep your urine pale yellow. This will help to thin the earwax. ?Keep all follow-up visits as told. If earwax builds up in your ears often or if you use hearing aids, consider seeing your health care provider for routine, preventive ear cleanings. Ask your health care provider how often you should schedule your cleanings. ?If you have hearing aids, clean them according to instructions from the manufacturer and your health care provider. ?Contact a health care provider if: ?You have ear pain. ?You develop a fever. ?You have pus or other fluid coming from your ear. ?You have hearing loss. ?You have ringing in your ears that does not go away. ?You feel like the room is spinning (vertigo). ?Your symptoms do not improve with treatment. ?Get help right away if: ?  You have bleeding from the affected ear. ?You have severe ear pain. ?Summary ?Earwax can build up in the ear and cause discomfort or hearing loss. ?The most common symptoms of this condition include reduced or muffled hearing, a feeling of fullness in the ear, or feeling that the ear is plugged. ?This condition may be diagnosed based on your symptoms, your  medical history, and an ear exam. ?This condition may be treated by using ear drops to soften the earwax or by having the earwax removed by a health care provider. ?Do not put any objects, including cotton swabs, into your ear. You can clean the opening of your ear canal with a washcloth or facial tissue. ?This information is not intended to replace advice given to you by your health care provider. Make sure you discuss any questions you have with your health care provider. ?Document Revised: 07/08/2019 Document Reviewed: 07/08/2019 ?Elsevier Patient Education ? 2022 Elsevier Inc. ? ? ? ?Amphetamine; Dextroamphetamine Tablets ?What is this medication? ?AMPHETAMINE; DEXTROAMPHETAMINE (am FET a meen; dex troe am FET a meen) treats attention-deficit hyperactivity disorder (ADHD). It works by improving focus and reducing impulsive behavior. It may also be used to treat narcolepsy. It works by promoting wakefulness. It belongs to a group of medications called stimulants. ?This medicine may be used for other purposes; ask your health care provider or pharmacist if you have questions. ?COMMON BRAND NAME(S): Adderall ?What should I tell my care team before I take this medication? ?They need to know if you have any of these conditions: ?Anxiety or panic attacks ?Circulation problems in fingers and toes (Raynaud's disease) ?Glaucoma ?Heart attack ?Heart disease ?High blood pressure ?History of alcohol or drug abuse or addiction ?Kidney disease ?Liver disease ?Mental health disease ?Previous suicide attempt by you or a family member ?Seizures ?Stroke ?Suicidal thoughts, plans, or attempt ?Thyroid disease ?Tourette's syndrome ?An unusual or allergic reaction to dextroamphetamine, other amphetamines, other medications, foods, dyes, or preservatives ?Pregnant or trying to get pregnant ?Breast-feeding ?How should I use this medication? ?Take this medication by mouth. Take it as directed on the prescription label at the same time  every day. You can take it with or without food. If it upsets your stomach, take it with food. Keep taking it unless your care team tells you to stop. ?A special MedGuide will be given to you by the pharmacist with each prescription and refill. Be sure to read this information carefully each time. ?Talk to your care team about the use of this medication in children. While it may be prescribed for children as young as 3 years for selected conditions, precautions do apply. ?Overdosage: If you think you have taken too much of this medicine contact a poison control center or emergency room at once. ?NOTE: This medicine is only for you. Do not share this medicine with others. ?What if I miss a dose? ?If you miss a dose, take it as soon as you can. If it is almost time for your next dose, take only that dose. Do not take double or extra doses. ?What may interact with this medication? ?Do not take this medication with any of the following: ?Linezolid ?MAOIs like Carbex, Eldepryl, Marplan, Nardil, and Parnate ?Methylene blue (injected into a vein) ?Other stimulant medications for attention disorders, weight loss, or to stay awake ?This medication may also interact with the following: ?Acetazolamide ?Ammonium chloride ?Ascorbic acid ?Atomoxetine ?Certain medications for blood pressure, heart disease, irregular heart beat ?Certain medications for depression, anxiety, or psychotic  disturbances ?Cold or allergy medications ?Lithium ?Methenamine ?Narcotic medicines for pain ?Quinidine ?Ritonavir ?Sodium bicarbonate ?St. John's Wort ?Tryptophan ?This list may not describe all possible interactions. Give your health care provider a list of all the medicines, herbs, non-prescription drugs, or dietary supplements you use. Also tell them if you smoke, drink alcohol, or use illegal drugs. Some items may interact with your medicine. ?What should I watch for while using this medication? ?Visit your care team for regular checks on your  progress. This prescription requires that you follow special procedures with your care team and pharmacy. You will need to have a new written prescription from your care team every time you need a refill

## 2021-06-20 ENCOUNTER — Other Ambulatory Visit (HOSPITAL_COMMUNITY): Payer: Self-pay

## 2021-06-22 ENCOUNTER — Other Ambulatory Visit (HOSPITAL_COMMUNITY): Payer: Self-pay

## 2021-06-23 ENCOUNTER — Other Ambulatory Visit (HOSPITAL_COMMUNITY): Payer: Self-pay

## 2021-06-27 DIAGNOSIS — F4323 Adjustment disorder with mixed anxiety and depressed mood: Secondary | ICD-10-CM | POA: Diagnosis not present

## 2021-07-12 ENCOUNTER — Encounter: Payer: Self-pay | Admitting: Nurse Practitioner

## 2021-07-12 ENCOUNTER — Telehealth (INDEPENDENT_AMBULATORY_CARE_PROVIDER_SITE_OTHER): Payer: 59 | Admitting: Nurse Practitioner

## 2021-07-12 DIAGNOSIS — F4323 Adjustment disorder with mixed anxiety and depressed mood: Secondary | ICD-10-CM | POA: Diagnosis not present

## 2021-07-12 DIAGNOSIS — F411 Generalized anxiety disorder: Secondary | ICD-10-CM | POA: Diagnosis not present

## 2021-07-12 NOTE — Progress Notes (Signed)
? ?Virtual Visit via Telephone Note  ? ?This visit type was conducted due to national recommendations for restrictions regarding the COVID-19 Pandemic (e.g. social distancing) in an effort to limit this patient's exposure and mitigate transmission in our community.  Due to her co-morbid illnesses, this patient is at least at moderate risk for complications without adequate follow up.  This format is felt to be most appropriate for this patient at this time.  The patient did not have access to video technology/had technical difficulties with video requiring transitioning to audio format only (telephone).  All issues noted in this document were discussed and addressed.  No physical exam could be performed with this format.  Patient verbally consented to a telehealth visit.  ? ?Date:  07/12/2021  ? ?ID:  Kent Braunschweig, DOB 1990-08-05, MRN [id number] ? ?Patient Location: Home ?Provider Location: Office/Clinic ? ?PCP:  Janie Morning, NP  ? ?Evaluation Performed:  Follow-Up Visit ? ?Chief Complaint:  Generalized anxiety disorder ? ?History of Present Illness:   ? ?Natalie Marks is a 31 y.o. female with Anxiety, Follow-up ? ?She was last seen for anxiety 4 weeks ago. ?Changes made at last visit include Buspar. ?Is not resting well, states her mind races at night. She has been taking one Buspar 5 mg tablet QHS ?  ?She reports excellent compliance with treatment. ?She reports excellent tolerance of treatment. ?She is not having side effects.  ? ?She feels her anxiety is moderate and Improved since last visit. ? ?Symptoms: ?No chest pain No difficulty concentrating  ?No dizziness No fatigue  ?No feelings of losing control Yes insomnia  ?No irritable No palpitations  ?No panic attacks Yes racing thoughts at night  ?No shortness of breath No sweating  ?No tremors/shakes   ? ?GAD-7 Results ? ?  06/14/2021  ?  8:50 AM 01/20/2021  ? 10:15 AM  ?GAD-7 Generalized Anxiety Disorder Screening Tool  ?1. Feeling Nervous,  Anxious, or on Edge 2 1  ?2. Not Being Able to Stop or Control Worrying 2 1  ?3. Worrying Too Much About Different Things 2 2  ?4. Trouble Relaxing 3 2  ?5. Being So Restless it's Hard To Sit Still 0 2  ?6. Becoming Easily Annoyed or Irritable 1 2  ?7. Feeling Afraid As If Something Awful Might Happen 0 2  ?Total GAD-7 Score 10 12  ?Difficulty At Work, Home, or Getting  Along With Others? Somewhat difficult Somewhat difficult  ? ? ?PHQ-9 Scores ? ?  01/06/2021  ?  3:34 PM 08/19/2019  ?  8:19 AM  ?PHQ9 SCORE ONLY  ?PHQ-9 Total Score 0 0  ? ? ? ? ?Past Medical History:  ?Diagnosis Date  ? Anemia   ? Generalized anxiety disorder 08/19/2019  ? Headache(784.0)   ? Migraine without aura, not intractable, without status migrainosus 08/19/2019  ? Other urticaria 08/19/2019  ? Postpartum care following vaginal delivery (4/6) 07/08/2015  ? ? ?Past Surgical History:  ?Procedure Laterality Date  ? NO PAST SURGERIES    ? ? ?Family History  ?Problem Relation Age of Onset  ? Diabetes Maternal Grandmother   ? Alcohol abuse Neg Hx   ? Arthritis Neg Hx   ? Asthma Neg Hx   ? Birth defects Neg Hx   ? Cancer Neg Hx   ? COPD Neg Hx   ? Depression Neg Hx   ? Drug abuse Neg Hx   ? Early death Neg Hx   ? Hearing loss Neg Hx   ?  Heart disease Neg Hx   ? Hyperlipidemia Neg Hx   ? Hypertension Neg Hx   ? Kidney disease Neg Hx   ? Learning disabilities Neg Hx   ? Mental illness Neg Hx   ? Mental retardation Neg Hx   ? Miscarriages / Stillbirths Neg Hx   ? Stroke Neg Hx   ? Vision loss Neg Hx   ? ? ?Social History  ? ?Socioeconomic History  ? Marital status: Married  ?  Spouse name: [name]  ? Number of children: 3  ? Years of education: Not on file  ? Highest education level: Not on file  ?Occupational History  ? Occupation: Psychologist, sport and exercise  ?Tobacco Use  ? Smoking status: Never  ? Smokeless tobacco: Never  ?Vaping Use  ? Vaping Use: Never used  ?Substance and Sexual Activity  ? Alcohol use: No  ? Drug use: No  ? Sexual activity: Yes  ?  Partners: Male  ?   Birth control/protection: I.U.D.  ?Other Topics Concern  ? Not on file  ?Social History Narrative  ? Not on file  ? ?Social Determinants of Health  ? ?Financial Resource Strain: Low Risk   ? Difficulty of Paying Living Expenses: Not hard at all  ?Food Insecurity: No Food Insecurity  ? Worried About Programme researcher, broadcasting/film/video in the Last Year: Never true  ? Ran Out of Food in the Last Year: Never true  ?Transportation Needs: No Transportation Needs  ? Lack of Transportation (Medical): No  ? Lack of Transportation (Non-Medical): No  ?Physical Activity: Inactive  ? Days of Exercise per Week: 0 days  ? Minutes of Exercise per Session: 0 min  ?Stress: No Stress Concern Present  ? Feeling of Stress : Not at all  ?Social Connections: Moderately Isolated  ? Frequency of Communication with Friends and Family: Twice a week  ? Frequency of Social Gatherings with Friends and Family: Twice a week  ? Attends Religious Services: More than 4 times per year  ? Active Member of Clubs or Organizations: No  ? Attends Banker Meetings: Never  ? Marital Status: Separated  ?Intimate Partner Violence: Not At Risk  ? Fear of Current or Ex-Partner: No  ? Emotionally Abused: No  ? Physically Abused: No  ? Sexually Abused: No  ? ? ?Outpatient Medications Prior to Visit  ?Medication Sig Dispense Refill  ? busPIRone (BUSPAR) 5 MG tablet Take 1 tablet (5 mg total) by mouth 2 (two) times daily. 60 tablet 0  ? folic acid (FOLVITE) 1 MG tablet Take 1 tablet by mouth once a day. 90 tablet 3  ? Rimegepant Sulfate (NURTEC) 75 MG TBDP Dissolve one tablet (75 mg) by mouth once for up to 1 dose as needed for migraines. 30 tablet 2  ? ?No facility-administered medications prior to visit.  ? ? ?Allergies:   Macrobid [nitrofurantoin macrocrystal]  ? ?Social History  ? ?Tobacco Use  ? Smoking status: Never  ? Smokeless tobacco: Never  ?Vaping Use  ? Vaping Use: Never used  ?Substance Use Topics  ? Alcohol use: No  ? Drug use: No  ?  ? ?Review of Systems   ?Constitutional:  Negative for chills and fever.  ?HENT:  Negative for congestion.   ?Respiratory:  Negative for cough.   ?Cardiovascular:  Negative for chest pain.  ?Neurological:  Negative for headaches.  ?Psychiatric/Behavioral:  The patient has insomnia.    ? ?Labs/Other Tests and Data Reviewed:   ? ?Recent Labs: ?01/20/2021:  ALT 13; BUN 9; Creatinine, Ser 0.67; Hemoglobin 13.2; Platelets 249; Potassium 4.3; Sodium 139; TSH 1.450  ? ?Recent Lipid Panel ?No results found for: CHOL, TRIG, HDL, CHOLHDL, LDLCALC, LDLDIRECT ? ?Wt Readings from Last 3 Encounters:  ?06/14/21 133 lb (60.3 kg)  ?01/20/21 135 lb (61.2 kg)  ?01/06/21 135 lb (61.2 kg)  ?  ? ?Objective:   ? ?Vital Signs:  There were no vitals taken for this visit.   ? ?Physical Exam No physical exam due to telemedicine visit ? ?ASSESSMENT & PLAN:   ?  ?1. GAD (generalized anxiety disorder)-not at goal ?-continue Buspar 5 mg daily, change from QHS to daytime to promote sleep ?-clean sleep hygiene ? ?  ? ?COVID-19 Education: ?The signs and symptoms of COVID-19 were discussed with the patient and how to seek care for testing (follow up with PCP or arrange E-visit). The importance of social distancing was discussed today. ? ? ?I spent 10 minutes dedicated to the care of this patient on the date of this encounter to include face-to-face time with the patient, as well as: EMR review ? ?Follow Up:  In Person in 3 month(s) ? ?Signed, ?Flonnie HailstoneShannon Srinika Delone, DNP ?07/12/2021 9:58 PM    ?[facility] [place]  ?

## 2021-07-26 DIAGNOSIS — F4323 Adjustment disorder with mixed anxiety and depressed mood: Secondary | ICD-10-CM | POA: Diagnosis not present

## 2021-08-09 DIAGNOSIS — F4323 Adjustment disorder with mixed anxiety and depressed mood: Secondary | ICD-10-CM | POA: Diagnosis not present

## 2021-08-23 DIAGNOSIS — F4323 Adjustment disorder with mixed anxiety and depressed mood: Secondary | ICD-10-CM | POA: Diagnosis not present

## 2021-09-05 DIAGNOSIS — H52223 Regular astigmatism, bilateral: Secondary | ICD-10-CM | POA: Diagnosis not present

## 2021-09-05 DIAGNOSIS — H5203 Hypermetropia, bilateral: Secondary | ICD-10-CM | POA: Diagnosis not present

## 2021-09-07 DIAGNOSIS — F4323 Adjustment disorder with mixed anxiety and depressed mood: Secondary | ICD-10-CM | POA: Diagnosis not present

## 2021-09-20 DIAGNOSIS — F4323 Adjustment disorder with mixed anxiety and depressed mood: Secondary | ICD-10-CM | POA: Diagnosis not present

## 2021-10-03 DIAGNOSIS — F4323 Adjustment disorder with mixed anxiety and depressed mood: Secondary | ICD-10-CM | POA: Diagnosis not present

## 2021-10-18 DIAGNOSIS — F4323 Adjustment disorder with mixed anxiety and depressed mood: Secondary | ICD-10-CM | POA: Diagnosis not present

## 2021-11-02 DIAGNOSIS — F4323 Adjustment disorder with mixed anxiety and depressed mood: Secondary | ICD-10-CM | POA: Diagnosis not present

## 2021-11-23 DIAGNOSIS — F4323 Adjustment disorder with mixed anxiety and depressed mood: Secondary | ICD-10-CM | POA: Diagnosis not present

## 2021-12-09 DIAGNOSIS — F4323 Adjustment disorder with mixed anxiety and depressed mood: Secondary | ICD-10-CM | POA: Diagnosis not present

## 2021-12-22 DIAGNOSIS — F4323 Adjustment disorder with mixed anxiety and depressed mood: Secondary | ICD-10-CM | POA: Diagnosis not present

## 2022-01-04 DIAGNOSIS — F4323 Adjustment disorder with mixed anxiety and depressed mood: Secondary | ICD-10-CM | POA: Diagnosis not present

## 2022-01-16 DIAGNOSIS — F4323 Adjustment disorder with mixed anxiety and depressed mood: Secondary | ICD-10-CM | POA: Diagnosis not present

## 2022-01-31 DIAGNOSIS — F4323 Adjustment disorder with mixed anxiety and depressed mood: Secondary | ICD-10-CM | POA: Diagnosis not present

## 2022-02-06 ENCOUNTER — Ambulatory Visit: Payer: 59

## 2022-02-14 DIAGNOSIS — F4323 Adjustment disorder with mixed anxiety and depressed mood: Secondary | ICD-10-CM | POA: Diagnosis not present

## 2022-02-28 DIAGNOSIS — F4323 Adjustment disorder with mixed anxiety and depressed mood: Secondary | ICD-10-CM | POA: Diagnosis not present

## 2022-03-14 DIAGNOSIS — F4323 Adjustment disorder with mixed anxiety and depressed mood: Secondary | ICD-10-CM | POA: Diagnosis not present

## 2022-04-04 DIAGNOSIS — F4323 Adjustment disorder with mixed anxiety and depressed mood: Secondary | ICD-10-CM | POA: Diagnosis not present

## 2022-04-18 DIAGNOSIS — F4323 Adjustment disorder with mixed anxiety and depressed mood: Secondary | ICD-10-CM | POA: Diagnosis not present

## 2022-04-27 ENCOUNTER — Other Ambulatory Visit: Payer: Self-pay

## 2022-04-27 ENCOUNTER — Ambulatory Visit (INDEPENDENT_AMBULATORY_CARE_PROVIDER_SITE_OTHER): Payer: 59 | Admitting: Nurse Practitioner

## 2022-04-27 ENCOUNTER — Encounter: Payer: Self-pay | Admitting: Nurse Practitioner

## 2022-04-27 VITALS — BP 100/70 | HR 84 | Temp 97.2°F | Ht 62.0 in | Wt 144.0 lb

## 2022-04-27 DIAGNOSIS — G43009 Migraine without aura, not intractable, without status migrainosus: Secondary | ICD-10-CM

## 2022-04-27 DIAGNOSIS — Z6826 Body mass index (BMI) 26.0-26.9, adult: Secondary | ICD-10-CM

## 2022-04-27 DIAGNOSIS — F411 Generalized anxiety disorder: Secondary | ICD-10-CM | POA: Diagnosis not present

## 2022-04-27 MED ORDER — UBRELVY 100 MG PO TABS
100.0000 mg | ORAL_TABLET | Freq: Every day | ORAL | 1 refills | Status: DC | PRN
  Filled 2022-04-27: qty 15, 30d supply, fill #0
  Filled 2022-06-18: qty 15, 15d supply, fill #0

## 2022-04-27 MED ORDER — QULIPTA 60 MG PO TABS
60.0000 mg | ORAL_TABLET | Freq: Every day | ORAL | 2 refills | Status: DC
  Filled 2022-04-27: qty 90, 90d supply, fill #0

## 2022-04-27 NOTE — Progress Notes (Signed)
Subjective:  Patient ID: Natalie Marks, female    DOB: May 06, 1990  Age: 32 y.o. MRN: [id number]  Chief Complaint  Patient presents with   Anxiety   Migraine   HPI: Pt presents for management of migraine headaches and depression/anxiety. Pt has not been seen for chronic medical conditions in 10 months. She was previously followed by [facility] and prescribed Paxil, Wellbutrin, and Klonopin.    Migraine, follow-up: Pt has a history of migraine headaches since around age 81. Pt reports worsening migraine headaches. States she is having migraine headaches almost daily. Reports headaches are interfering with her quality of life. She tells me she believes stress and anxiety are triggering migraines. She has associated symptoms of nausea, photophobia, and phonophobia. Previously treated with preventive and abortive medications of Emgality, Nurtec, and Maxalt. Treats nausea with Zofran. Denies previous referral to neurology for migraine management. She wears prescription lenses. She had a recent eye exam 2023.   Depression and Anxiety:  She was last seen for anxiety 9 months ago. Current treatment includes no current medications. Buspar was not helpful. Family history unknown for fathers side, believes her Mother has depression but has never been dx. She feels her anxiety is moderate and Worse since last visit.   GAD-7 Results    04/27/2022    9:25 AM 07/12/2021   10:02 AM 06/14/2021    8:50 AM  GAD-7 Generalized Anxiety Disorder Screening Tool  1. Feeling Nervous, Anxious, or on Edge 1 1 2   2. Not Being Able to Stop or Control Worrying 2 1 2   3. Worrying Too Much About Different Things 1 1 2   4. Trouble Relaxing 1 1 3   5. Being So Restless it's Hard To Sit Still 2 1 0  6. Becoming Easily Annoyed or Irritable 2 0 1  7. Feeling Afraid As If Something Awful Might Happen 1 0 0  Total GAD-7 Score 10 5 10   Difficulty At Work, Home, or Getting  Along With Others? Somewhat difficult  Somewhat difficult Somewhat difficult    PHQ-9 Scores    04/27/2022    9:26 AM 01/06/2021    3:34 PM 08/19/2019    8:19 AM  PHQ9 SCORE ONLY  PHQ-9 Total Score 9 0 0   Migraines: States she has dealt with migraines off and on for years. Migraines have become more frequent over the past 3 months, reports having migraines approx every 3 days accompanied with floaters, nausea and dizziness. Nurtec does help.     04/27/2022    9:26 AM 01/06/2021    3:34 PM 08/19/2019    8:19 AM  Depression screen PHQ 2/9  Decreased Interest 2 0 0  Down, Depressed, Hopeless 1 0 0  PHQ - 2 Score 3 0 0  Altered sleeping 1    Tired, decreased energy 2    Change in appetite 2    Feeling bad or failure about yourself  1    Trouble concentrating 0    Moving slowly or fidgety/restless 0    Suicidal thoughts 0    PHQ-9 Score 9    Difficult doing work/chores Somewhat difficult           12/19/2018    5:45 PM 12/19/2018   11:22 PM 12/20/2018    7:30 AM 01/06/2021    3:34 PM 04/27/2022    9:20 AM  [place] in the past year?    0 0  Was there an injury with Fall?    0  0  Fall Risk Category Calculator    0 0  Fall Risk Category (Retired)    Low   (RETIRED) Patient Fall Risk Level Moderate fall risk Low fall risk Low fall risk Low fall risk   Patient at Risk for Falls Due to    No Fall Risks No Fall Risks  Fall risk Follow up    Falls evaluation completed Falls evaluation completed      Review of Systems See pertinent positives and negatives per HPI.  Current Outpatient Medications on File Prior to Visit  Medication Sig Dispense Refill   Rimegepant Sulfate (NURTEC) 75 MG TBDP Dissolve one tablet (75 mg) by mouth once for up to 1 dose as needed for migraines. 30 tablet 2   No current facility-administered medications on file prior to visit.   Past Medical History:  Diagnosis Date   Anemia    Generalized anxiety disorder 08/19/2019   Headache(784.0)    Migraine without aura, not intractable,  without status migrainosus 08/19/2019   Other urticaria 08/19/2019   Postpartum care following vaginal delivery (4/6) 07/08/2015   Past Surgical History:  Procedure Laterality Date   NO PAST SURGERIES      Family History  Problem Relation Age of Onset   Diabetes Maternal Grandmother    Alcohol abuse Neg Hx    Arthritis Neg Hx    Asthma Neg Hx    Birth defects Neg Hx    Cancer Neg Hx    COPD Neg Hx    Depression Neg Hx    Drug abuse Neg Hx    Early death Neg Hx    Hearing loss Neg Hx    Heart disease Neg Hx    Hyperlipidemia Neg Hx    Hypertension Neg Hx    Kidney disease Neg Hx    Learning disabilities Neg Hx    Mental illness Neg Hx    Mental retardation Neg Hx    Miscarriages / Stillbirths Neg Hx    Stroke Neg Hx    Vision loss Neg Hx    Social History   Socioeconomic History   Marital status: Legally Separated    Spouse name: Tim   Number of children: 3   Years of education: Not on file   Highest education level: Not on file  Occupational History   Occupation: Chartered certified accountant  Tobacco Use   Smoking status: Never   Smokeless tobacco: Never  Vaping Use   Vaping Use: Never used  Substance and Sexual Activity   Alcohol use: No   Drug use: No   Sexual activity: Yes    Partners: Male    Birth control/protection: I.U.D.  Other Topics Concern   Not on file  Social History Narrative   Not on file   Social Determinants of Health   Financial Resource Strain: Low Risk  (06/14/2021)   Overall Financial Resource Strain (CARDIA)    Difficulty of Paying Living Expenses: Not hard at all  Food Insecurity: No Food Insecurity (06/14/2021)   Hunger Vital Sign    Worried About Running Out of Food in the Last Year: Never true    Ran Out of Food in the Last Year: Never true  Transportation Needs: No Transportation Needs (06/14/2021)   PRAPARE - Hydrologist (Medical): No    Lack of Transportation (Non-Medical): No  Physical Activity: Inactive  (06/14/2021)   Exercise Vital Sign    Days of Exercise per Week: 0 days  Minutes of Exercise per Session: 0 min  Stress: No Stress Concern Present (06/14/2021)   [place]    Feeling of Stress : Not at all  Social Connections: Moderately Isolated (06/14/2021)   Social Connection and Isolation Panel [NHANES]    Frequency of Communication with Friends and Family: Twice a week    Frequency of Social Gatherings with Friends and Family: Twice a week    Attends Religious Services: More than 4 times per year    Active Member of [employer] or Organizations: No    Attends Music therapist: Never    Marital Status: Separated    Objective:  BP 100/70   Pulse 84   Temp (!) 97.2 F (36.2 C)   Ht 5\' 2"  (1.575 m)   Wt 144 lb (65.3 kg)   LMP 04/10/2022   SpO2 99%   BMI 26.34 kg/m      04/27/2022    9:19 AM 06/14/2021    8:47 AM 01/20/2021   10:02 AM  BP/Weight  Systolic BP 024 097 353  Diastolic BP 70 80 64  Wt. (Lbs) 144 133 135  BMI 26.34 kg/m2 25.13 kg/m2 25.51 kg/m2    Physical Exam Vitals reviewed.  Constitutional:      Appearance: Normal appearance.  HENT:     Right Ear: Tympanic membrane normal.     Left Ear: Tympanic membrane normal.     Nose: Nose normal.  Eyes:     Pupils: Pupils are equal, round, and reactive to light.  Cardiovascular:     Rate and Rhythm: Normal rate and regular rhythm.     Pulses: Normal pulses.     Heart sounds: Normal heart sounds.  Pulmonary:     Effort: Pulmonary effort is normal.     Breath sounds: Normal breath sounds.  Skin:    General: Skin is warm.     Capillary Refill: Capillary refill takes less than 2 seconds.  Neurological:     General: No focal deficit present.     Mental Status: She is alert and oriented to person, place, and time.    Lab Results  Component Value Date   WBC 5.6 01/20/2021   HGB 13.2 01/20/2021   HCT 39.6 01/20/2021   PLT 249  01/20/2021   GLUCOSE 88 01/20/2021   ALT 13 01/20/2021   AST 18 01/20/2021   NA 139 01/20/2021   K 4.3 01/20/2021   CL 103 01/20/2021   CREATININE 0.67 01/20/2021   BUN 9 01/20/2021   CO2 21 01/20/2021   TSH 1.450 01/20/2021      Assessment & Plan:    1. Migraine without aura, not intractable, without status migrainosus - Atogepant (QULIPTA) 60 MG TABS; Take 1 tablet (60 mg total) by mouth daily.  Dispense: 90 tablet; Refill: 2 - Ubrogepant (UBRELVY) 100 MG TABS; Take 1 tablet (100 mg total) by mouth daily as needed (migraine headache, may repeat in 2 hours, do not take more than 2 tablets in 24 hours).  Dispense: 15 tablet; Refill: 1 - T4, free - TSH - VITAMIN D 25 Hydroxy (Vit-D Deficiency, Fractures) - CBC with Differential/Platelet - Comprehensive metabolic panel -consider neurology referral for migraine management if symptoms fail to improve  2. GAD (generalized anxiety disorder) - T4, free - TSH - CBC with Differential/Platelet - Comprehensive metabolic panel  3. BMI 26.0-26.9,adult     Begin Qulipta 60 mg daily for migraine prevention Take Ubrelvy 100 mg tablet  by mouth for abortive treatment of migraine, may repeat one time in 2 hours if migraine has not improved, DO NOT TAKE MORE THAN 2 TABLETS IN 24 HOURS Keep migraine headache log to identify possible triggers Follow-up in 4 weeks for migraine management and to address anxiety  Follow-up: 4-weeks  An After Visit Summary was printed and given to the patient.  I, Janie Morning, NP, have reviewed all documentation for this visit. The documentation on 04/27/22 for the exam, diagnosis, procedures, and orders are all accurate and complete.   Janie Morning, NP [facility] [phone]

## 2022-04-27 NOTE — Patient Instructions (Addendum)
Begin Qulipta 60 mg daily for migraine prevention Take Ubrelvy 100 mg tablet by mouth for abortive treatment of migraine, may repeat one time in 2 hours if migraine has not improved, DO NOT TAKE MORE THAN 2 TABLETS IN 24 HOURS Keep migraine headache log to identify possible triggers Follow-up in 4 weeks for migraine management and to address anxiety  Chronic Migraine Headache A migraine headache is throbbing pain that is usually on one side of the head. Migraines that keep coming back are called recurring migraines. A migraine is called a chronic migraine if it happens at least 15 days in a month for more than 3 months. Talk with your doctor about what things may bring on (trigger) your migraines. What are the causes? The exact cause of this condition is not known. A migraine may be caused when nerves in the brain become irritated and release chemicals that cause irritation and swelling (inflammation) of blood vessels. The irritation and swelling of the blood vessels causes pain. Migraines may be brought on or caused by: Smoking. Foods and drinks, such as: Cheese. Chocolate. Alcohol. Caffeine. Certain substances in some foods or drinks. Some medicines. Other things that may bring on a migraine include: Periods, for women. Stress. Not enough sleep or too much sleep. Feeling very tired. Bright lights or loud noises. Smells Weather changes and being at high altitude. What increases the risk? The following factors may make you more likely to have chronic migraine: Having migraines or family members who have them. Being very sad (depressed) or feeling worried or nervous (anxious). Taking a lot of pain medicine. Having problems sleeping. Having heart disease, diabetes, or being very overweight (obese). What are the signs or symptoms? Symptoms of this condition include: Pain that feels like it throbs. Pain that is usually only on one side of the head. In some cases, the pain may be on  both sides of the head or around the head or neck. Very bad pain that keeps you from doing daily activities. Pain that gets worse with activity. Feeling like you may vomit (feeling nauseous) or vomiting. Pain when you are around bright lights, loud noises, or activity. Being sensitive to bright lights, loud noises, or smells. Feeling dizzy. How is this treated? This condition is treated with: Medicines. These help to: Lessen pain and the feeling like you may vomit. Prevent migraines. Changes to your diet or sleep. Therapy. This might include: Relaxation training. Biofeedback. This is a treatment that teaches you to relax, use your brain to lower your heart rate, and control your breathing. Cognitive behavioral therapy (CBT). This therapy helps you set goals and follow up on the changes that you make. Acupuncture. Using a device that provides electrical stimulation to your nerves, which can help take away pain. Surgery, if the other treatments do not work. Follow these instructions at home: Medicines Take over-the-counter and prescription medicines only as told by your doctor. Ask your doctor if the medicine prescribed to you requires you to avoid driving or using machinery. Lifestyle Do not use any products that contain nicotine or tobacco, such as cigarettes, e-cigarettes, and chewing tobacco. If you need help quitting, ask your doctor. Do not drink alcohol. Get 7-9 hours of sleep each night. Lower the stress in your life. Ask your doctor about ways to do this. Stay at a healthy weight. Talk with your doctor if you need help losing weight. Get regular exercise. General instructions Keep a journal to find out if certain things bring on migraines.  For example, write down: What you eat and drink. How much sleep you get. Any change to your diet or medicines. Lie down in a dark, quiet room when you have a migraine. Try placing a cool towel over your head when you have a  migraine. Keep lights dim if bright lights bother you or make your migraines worse. Keep all follow-up visits as told by your doctor. This is important. Where to find more information Coalition for Headache and Migraine Patients (CHAMP): headachemigraine.org American Migraine Foundation: americanmigrainefoundation.org National Headache Foundation: headaches.org Contact a doctor if: Medicine does not help your migraine. Your pain keeps coming back. Get help right away if: Your migraine becomes really bad and medicine does not help. You have a stiff neck and fever. You have trouble seeing. Your muscles are weak or you lose control of them. You lose your balance or have trouble walking. You feel like you will faint or you faint. You start having sudden, very bad headaches. You have a seizure. Summary A migraine headache is very bad, throbbing pain that is usually on one side of the head. A chronic migraine is a migraine that happens 15 days in a month for more than 3 months. Talk with your doctor about what things may bring on your migraines. Lie down in a dark, quiet room when you have a migraine. Keep a journal. This can help you find out if certain things make you have migraines. This information is not intended to replace advice given to you by your health care provider. Make sure you discuss any questions you have with your health care provider. Document Revised: 09/01/2021 Document Reviewed: 05/07/2019 Elsevier Patient Education  [place].  Form - Headache Record There are many types and causes of headaches. A headache record can help guide your treatment plan. Use this form to record the details. Bring this form with you to your follow-up visits. Follow your health care provider's instructions on how to describe your headache. You may be asked to: Use a pain scale. This is a tool to rate the intensity of your headache using words or numbers. Describe what your headache  feels like, such as dull, achy, throbbing, or sharp. Headache record Date: _______________ Time (from start to end): ____________________ Location of the headache: _________________________ Intensity of the headache: ____________________ Description of the headache: ______________________________________________________________ Hours of sleep the night before the headache: __________ Food or drinks before the headache started: ______________________________________________________________________________________ Events before the headache started: _______________________________________________________________________________________________ Symptoms before the headache started: __________________________________________________________________________________________ Symptoms during the headache: __________________________________________________________________________________________________ Treatment: ________________________________________________________________________________________________________________ Effect of treatment: _________________________________________________________________________________________________________ Other comments: ___________________________________________________________________________________________________________ Date: _______________ Time (from start to end): ____________________ Location of the headache: _________________________ Intensity of the headache: ____________________ Description of the headache: ______________________________________________________________ Hours of sleep the night before the headache: __________ Food or drinks before the headache started: ______________________________________________________________________________________ Events before the headache started: ____________________________________________________________________________________________ Symptoms before the headache started:  _________________________________________________________________________________________ Symptoms during the headache: _______________________________________________________________________________________________ Treatment: ________________________________________________________________________________________________________________ Effect of treatment: _________________________________________________________________________________________________________ Other comments: ___________________________________________________________________________________________________________ Date: _______________ Time (from start to end): ____________________ Location of the headache: _________________________ Intensity of the headache: ____________________ Description of the headache: ______________________________________________________________ Hours of sleep the night before the headache: __________ Food or drinks before the headache started: ______________________________________________________________________________________ Events before the headache started: ____________________________________________________________________________________________ Symptoms before the headache started: _________________________________________________________________________________________ Symptoms during the headache: _______________________________________________________________________________________________ Treatment: ________________________________________________________________________________________________________________ Effect of treatment: _________________________________________________________________________________________________________ Other comments: ___________________________________________________________________________________________________________ Date: _______________ Time (from start to end): ____________________ Location of the headache: _________________________ Intensity of  the headache: ____________________ Description of  the headache: ______________________________________________________________ Hours of sleep the night before the headache: _________ Food or drinks before the headache started: ______________________________________________________________________________________ Events before the headache started: ____________________________________________________________________________________________ Symptoms before the headache started: _________________________________________________________________________________________ Symptoms during the headache: _______________________________________________________________________________________________ Treatment: ________________________________________________________________________________________________________________ Effect of treatment: _________________________________________________________________________________________________________ Other comments: ___________________________________________________________________________________________________________ Date: _______________ Time (from start to end): ____________________ Location of the headache: _________________________ Intensity of the headache: ____________________ Description of the headache: ______________________________________________________________ Hours of sleep the night before the headache: _________ Food or drinks before the headache started: ______________________________________________________________________________________ Events before the headache started: ____________________________________________________________________________________________ Symptoms before the headache started: _________________________________________________________________________________________ Symptoms during the headache: _______________________________________________________________________________________________ Treatment:  ________________________________________________________________________________________________________________ Effect of treatment: _________________________________________________________________________________________________________ Other comments: ___________________________________________________________________________________________________________ This information is not intended to replace advice given to you by your health care provider. Make sure you discuss any questions you have with your health care provider. Document Revised: 08/18/2020 Document Reviewed: 08/18/2020 Elsevier Patient Education  2023 [employer].

## 2022-04-28 LAB — COMPREHENSIVE METABOLIC PANEL
ALT: 36 IU/L — ABNORMAL HIGH (ref 0–32)
AST: 26 IU/L (ref 0–40)
Albumin/Globulin Ratio: 1.6 (ref 1.2–2.2)
Albumin: 4.2 g/dL (ref 4.0–5.0)
Alkaline Phosphatase: 53 IU/L (ref 44–121)
BUN/Creatinine Ratio: 13 (ref 9–23)
BUN: 9 mg/dL (ref 6–20)
Bilirubin Total: 0.3 mg/dL (ref 0.0–1.2)
CO2: 22 mmol/L (ref 20–29)
Calcium: 9 mg/dL (ref 8.7–10.2)
Chloride: 102 mmol/L (ref 96–106)
Creatinine, Ser: 0.69 mg/dL (ref 0.57–1.00)
Globulin, Total: 2.7 g/dL (ref 1.5–4.5)
Glucose: 85 mg/dL (ref 70–99)
Potassium: 4.2 mmol/L (ref 3.5–5.2)
Sodium: 138 mmol/L (ref 134–144)
Total Protein: 6.9 g/dL (ref 6.0–8.5)
eGFR: 120 mL/min/{1.73_m2} (ref >59)

## 2022-04-28 LAB — VITAMIN D 25 HYDROXY (VIT D DEFICIENCY, FRACTURES): Vit D, 25-Hydroxy: 22.1 ng/mL — ABNORMAL LOW (ref 30.0–100.0)

## 2022-04-28 LAB — CBC WITH DIFFERENTIAL/PLATELET
Basophils Absolute: 0 10*3/uL (ref 0.0–0.2)
Basos: 1 %
EOS (ABSOLUTE): 0.1 10*3/uL (ref 0.0–0.4)
Eos: 1 %
Hematocrit: 38.5 % (ref 34.0–46.6)
Hemoglobin: 12.8 g/dL (ref 11.1–15.9)
Immature Grans (Abs): 0 10*3/uL (ref 0.0–0.1)
Immature Granulocytes: 0 %
Lymphocytes Absolute: 1.5 10*3/uL (ref 0.7–3.1)
Lymphs: 28 %
MCH: 30.4 pg (ref 26.6–33.0)
MCHC: 33.2 g/dL (ref 31.5–35.7)
MCV: 91 fL (ref 79–97)
Monocytes Absolute: 0.4 10*3/uL (ref 0.1–0.9)
Monocytes: 8 %
Neutrophils Absolute: 3.4 10*3/uL (ref 1.4–7.0)
Neutrophils: 62 %
Platelets: 225 10*3/uL (ref 150–450)
RBC: 4.21 x10E6/uL (ref 3.77–5.28)
RDW: 12.8 % (ref 11.7–15.4)
WBC: 5.4 10*3/uL (ref 3.4–10.8)

## 2022-04-28 LAB — TSH: TSH: 1.48 u[IU]/mL (ref 0.450–4.500)

## 2022-04-28 LAB — T4, FREE: Free T4: 1.24 ng/dL (ref 0.82–1.77)

## 2022-05-02 ENCOUNTER — Other Ambulatory Visit (HOSPITAL_COMMUNITY): Payer: Self-pay

## 2022-05-02 ENCOUNTER — Encounter (HOSPITAL_COMMUNITY): Payer: Self-pay

## 2022-05-02 ENCOUNTER — Other Ambulatory Visit: Payer: Self-pay

## 2022-05-02 DIAGNOSIS — F4323 Adjustment disorder with mixed anxiety and depressed mood: Secondary | ICD-10-CM | POA: Diagnosis not present

## 2022-05-02 MED ORDER — VITAMIN D (ERGOCALCIFEROL) 1.25 MG (50000 UNIT) PO CAPS
50000.0000 [IU] | ORAL_CAPSULE | ORAL | 2 refills | Status: DC
  Filled 2022-05-02: qty 5, 35d supply, fill #0
  Filled 2022-06-18: qty 5, 35d supply, fill #1

## 2022-05-15 ENCOUNTER — Telehealth: Payer: Self-pay

## 2022-05-15 DIAGNOSIS — Z0289 Encounter for other administrative examinations: Secondary | ICD-10-CM

## 2022-05-15 NOTE — Telephone Encounter (Signed)
I called the patient today to let her know that the disability/FMLA forms are ready and there is a fee for this form. I have not faxed the papers as patient has not paid. Waiting for the patient to call the office back or come to the office. I stated that the papers are unable to be sent until payment is made. Papers are upfront in the pickup drawer.

## 2022-05-16 ENCOUNTER — Other Ambulatory Visit: Payer: Self-pay

## 2022-05-16 DIAGNOSIS — F411 Generalized anxiety disorder: Secondary | ICD-10-CM | POA: Diagnosis not present

## 2022-05-18 ENCOUNTER — Other Ambulatory Visit (HOSPITAL_COMMUNITY): Payer: Self-pay

## 2022-05-26 ENCOUNTER — Ambulatory Visit (INDEPENDENT_AMBULATORY_CARE_PROVIDER_SITE_OTHER): Payer: 59 | Admitting: Nurse Practitioner

## 2022-05-26 ENCOUNTER — Other Ambulatory Visit: Payer: Self-pay

## 2022-05-26 ENCOUNTER — Encounter: Payer: Self-pay | Admitting: Nurse Practitioner

## 2022-05-26 VITALS — BP 96/60 | HR 73 | Temp 97.2°F | Resp 18 | Ht 61.0 in | Wt 146.6 lb

## 2022-05-26 DIAGNOSIS — R5383 Other fatigue: Secondary | ICD-10-CM

## 2022-05-26 DIAGNOSIS — G43009 Migraine without aura, not intractable, without status migrainosus: Secondary | ICD-10-CM

## 2022-05-26 DIAGNOSIS — E559 Vitamin D deficiency, unspecified: Secondary | ICD-10-CM | POA: Diagnosis not present

## 2022-05-26 DIAGNOSIS — F411 Generalized anxiety disorder: Secondary | ICD-10-CM

## 2022-05-26 MED ORDER — ESCITALOPRAM OXALATE 10 MG PO TABS
10.0000 mg | ORAL_TABLET | Freq: Every day | ORAL | 0 refills | Status: DC
  Filled 2022-05-26: qty 30, 30d supply, fill #0

## 2022-05-26 NOTE — Progress Notes (Signed)
Subjective:  Patient ID: Natalie Marks, female    DOB: 08-31-1990  Age: 32 y.o. MRN: [id number]  CHIEF COMPLAINT: Follow up regarding  migraine medicines and also wants to discuss about her anxiety.  Patient states that when she gets nervous her stomach will ache and feels like flushed while being frustrated.   History of Present illness:  Follow up regarding  migraine medicines. She is not taking Qulipta 60 mg daily, she said she had issues with prior authorization and she never needed that. She is good with Ubrelvy 100 mg  AS NEEDED.   For anxiety/depression patient used to take  wellbutrin 150 mg every 24 hours in 2021, patient left to take that medicine. She was taking hydroxyzine 25 mg OD in 2022. Right now patient wants to be on medicine for anxiety/depression. She is also concerned that she is having fatigue. Last time her vitamin D was low and she is taking Vitamin D oral every week now. She also wants to know about her Iron Panel and Vitamin B12. She wants to know is there anything that is making her feeling tired.   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      05/26/2022   10:05 AM 04/27/2022    9:25 AM 07/12/2021   10:02 AM 06/14/2021    8:50 AM  GAD 7 : Generalized Anxiety Score  Nervous, Anxious, on Edge '3 1 1 2  '$ Control/stop worrying '3 2 1 2  '$ Worry too much - different things '3 1 1 2  '$ Trouble relaxing '2 1 1 3  '$ Restless '2 2 1 '$ 0  Easily annoyed or irritable 1 2 0 1  Afraid - awful might happen 2 1 0 0  Total GAD 7 Score '16 10 5 10  '$ Anxiety Difficulty Very difficult Somewhat difficult Somewhat difficult Somewhat difficult        05/26/2022   10:06 AM 04/27/2022    9:26 AM 01/06/2021    3:34 PM 08/19/2019    8:19 AM  Depression screen PHQ 2/9  Decreased Interest 1 2 0 0  Down, Depressed, Hopeless 3 1 0 0  PHQ - 2 Score 4 3 0  0  Altered sleeping 3 1    Tired, decreased energy 3 2    Change in appetite 3 2    Feeling bad or failure about yourself  2 1    Trouble concentrating 3 0    Moving slowly or fidgety/restless 0 0    Suicidal thoughts 0 0    PHQ-9 Score 18 9    Difficult doing work/chores Very difficult Somewhat difficult       Current Outpatient Medications on File Prior to Visit  Medication Sig Dispense Refill   Ubrogepant (UBRELVY) 100 MG TABS Take 1 tablet (100 mg total) by mouth daily as needed (migraine headache, may repeat in 2 hours, do not take more than 2 tablets in 24 hours). 15 tablet 1   Vitamin D, Ergocalciferol, (DRISDOL) 1.25 MG (50000 UNIT) CAPS capsule Take 1 capsule (50,000 Units total) by mouth every 7 (seven) days. 5 capsule 2   Atogepant (QULIPTA) 60 MG TABS Take 1 tablet (60 mg total) by mouth daily. (Patient not taking: Reported on 05/26/2022) 90 tablet 2   No current facility-administered medications on file prior to visit.   Past Medical  History:  Diagnosis Date   Anemia    Generalized anxiety disorder 08/19/2019   Headache(784.0)    Migraine without aura, not intractable, without status migrainosus 08/19/2019   Other urticaria 08/19/2019   Postpartum care following vaginal delivery (4/6) 07/08/2015   Past Surgical History:  Procedure Laterality Date   NO PAST SURGERIES      Family History  Problem Relation Age of Onset   Diabetes Maternal Grandmother    Alcohol abuse Neg Hx    Arthritis Neg Hx    Asthma Neg Hx    Birth defects Neg Hx    Cancer Neg Hx    COPD Neg Hx    Depression Neg Hx    Drug abuse Neg Hx    Early death Neg Hx    Hearing loss Neg Hx    Heart disease Neg Hx    Hyperlipidemia Neg Hx    Hypertension Neg Hx    Kidney disease Neg Hx    Learning disabilities Neg Hx    Mental illness Neg Hx    Mental retardation Neg Hx    Miscarriages / Stillbirths Neg Hx    Stroke Neg Hx    Vision loss Neg Hx    Social History   Socioeconomic History    Marital status: Legally Separated    Spouse name: Tim   Number of children: 3   Years of education: Not on file   Highest education level: Not on file  Occupational History   Occupation: Chartered certified accountant  Tobacco Use   Smoking status: Never   Smokeless tobacco: Never  Vaping Use   Vaping Use: Never used  Substance and Sexual Activity   Alcohol use: No   Drug use: No   Sexual activity: Yes    Partners: Male    Birth control/protection: I.U.D.  Other Topics Concern   Not on file  Social History Narrative   Not on file   Social Determinants of Health   Financial Resource Strain: Low Risk  (06/14/2021)   Overall Financial Resource Strain (CARDIA)    Difficulty of Paying Living Expenses: Not hard at all  Food Insecurity: No Food Insecurity (06/14/2021)   Hunger Vital Sign    Worried About Running Out of Food in the Last Year: Never true    Ran Out of Food in the Last Year: Never true  Transportation Needs: No Transportation Needs (06/14/2021)   PRAPARE - Hydrologist (Medical): No    Lack of Transportation (Non-Medical): No  Physical Activity: Inactive (06/14/2021)   Exercise Vital Sign    Days of Exercise per Week: 0 days    Minutes of Exercise per Session: 0 min  Stress: No Stress Concern Present (06/14/2021)   [place]    Feeling of Stress : Not at all  Social Connections: Moderately Isolated (06/14/2021)   Social Connection and Isolation Panel [NHANES]    Frequency of Communication with Friends and Family: Twice a week    Frequency of Social Gatherings with Friends and Family: Twice a week    Attends Religious Services: More than 4 times per year    Active Member of [employer] or Organizations: No    Attends Archivist Meetings: Never    Marital Status: Separated    Review of Systems  Constitutional:  Negative for chills, diaphoresis, fatigue and fever.  HENT:  Negative for  congestion, ear pain, sinus pain and sore throat.  Respiratory:  Negative for cough and shortness of breath.   Cardiovascular:  Negative for chest pain, palpitations and leg swelling.  Gastrointestinal:  Negative for abdominal pain, constipation, diarrhea, nausea and vomiting.  Endocrine: Negative for polydipsia, polyphagia and polyuria.  Genitourinary:  Negative for dysuria and frequency.  Musculoskeletal:  Negative for arthralgias, back pain and myalgias.  Neurological:  Positive for dizziness. Negative for headaches.  Psychiatric/Behavioral:  Positive for sleep disturbance. Negative for dysphoric mood. The patient is nervous/anxious.       05/26/2022   10:05 AM 04/27/2022    9:25 AM 07/12/2021   10:02 AM 06/14/2021    8:50 AM  GAD 7 : Generalized Anxiety Score  Nervous, Anxious, on Edge '3 1 1 2  '$ Control/stop worrying '3 2 1 2  '$ Worry too much - different things '3 1 1 2  '$ Trouble relaxing '2 1 1 3  '$ Restless '2 2 1 '$ 0  Easily annoyed or irritable 1 2 0 1  Afraid - awful might happen 2 1 0 0  Total GAD 7 Score '16 10 5 10  '$ Anxiety Difficulty Very difficult Somewhat difficult Somewhat difficult Somewhat difficult     Flowsheet Row Office Visit from 05/26/2022 in [place]  PHQ-9 Total Score 18        Objective:  BP 96/60   Pulse 73   Temp (!) 97.2 F (36.2 C)   Resp 18   Ht '5\' 1"'$  (1.549 m)   Wt 146 lb 9.6 oz (66.5 kg)   LMP 04/10/2022   SpO2 100%   BMI 27.70 kg/m      05/26/2022    9:59 AM 04/27/2022    9:19 AM 06/14/2021    8:47 AM  BP/Weight  Systolic BP 96 [id number]  Diastolic BP 60 70 80  Wt. (Lbs) 146.6 144 133  BMI 27.7 kg/m2 26.34 kg/m2 25.13 kg/m2    Physical Exam Vitals reviewed.  Constitutional:      Appearance: Normal appearance. She is normal weight.  Neck:     Vascular: No carotid bruit.  Cardiovascular:     Rate and Rhythm: Normal rate and regular rhythm.     Heart sounds: Normal heart sounds.  Pulmonary:     Effort: Pulmonary  effort is normal.     Breath sounds: Normal breath sounds.  Abdominal:     General: Abdomen is flat. Bowel sounds are normal.     Palpations: Abdomen is soft.     Tenderness: There is no abdominal tenderness.  Musculoskeletal:        General: No swelling or tenderness.     Cervical back: Normal range of motion.  Neurological:     Mental Status: She is alert and oriented to person, place, and time.  Psychiatric:        Mood and Affect: Mood normal.        Behavior: Behavior normal.     Comments: Teary while telling her stories      Lab Results  Component Value Date   WBC 5.4 04/27/2022   HGB 12.8 04/27/2022   HCT 38.5 04/27/2022   PLT 225 04/27/2022   GLUCOSE 85 04/27/2022   ALT 36 (H) 04/27/2022   AST 26 04/27/2022   NA 138 04/27/2022   K 4.2 04/27/2022   CL 102 04/27/2022   CREATININE 0.69 04/27/2022   BUN 9 04/27/2022   CO2 22 04/27/2022   TSH 1.480 04/27/2022    Assessment & Plan:   Migraine without  aura, not intractable, without status migrainosus Assessment & Plan: Controlled with Ubrelvy 100 mg OD as needed   Other fatigue Assessment & Plan: Vitamin B12 and iron Panel will be checked today  Orders: -     Iron, TIBC and Ferritin Panel -     Vitamin B12 -     Escitalopram Oxalate; Take 1 tablet (10 mg total) by mouth daily.  Dispense: 30 tablet; Refill: 0  Generalized anxiety disorder Assessment & Plan: PHQ 18 GAD 16  Will Start Lexapro 10 mg daily Discussed potential side effects, incl GI upset, sexual dysfunction, increased anxiety, and SI Discussed that it can take 6-8 weeks to reach full efficacy Contracted for safety - no SI/HI Discussed synergistic effects of medications and therapy   Follow up in 1 month   Vitamin D deficiency Assessment & Plan: Taking Vitamin D oral once a week Will repeat lab in 3 months Encouraged to drink vitamin D rich diets    Follow-up: Return in about 1 month (around 06/24/2022).  An After Visit Summary was  printed and given to the patient.  I, Neil Crouch have reviewed all documentation for this visit. The documentation on 05/26/22   for the exam, diagnosis, procedures, and orders are all accurate and complete.    Neil Crouch, DNP, [facility] [phone]

## 2022-05-26 NOTE — Assessment & Plan Note (Signed)
Vitamin B12 and iron Panel will be checked today

## 2022-05-26 NOTE — Patient Instructions (Signed)
Follow up in 1 months Will Start 10 mg daily Discussed potential side effects, incl GI upset, sexual dysfunction, increased anxiety, and SI Discussed that it can take 6-8 weeks to reach full efficacy Contracted for safety - no SI/HI Discussed synergistic effects of medications and therapy    Escitalopram Tablets What is this medication? ESCITALOPRAM (es sye TAL oh pram) treats depression and anxiety. It increases the amount of serotonin in the brain, a hormone that helps regulate mood. It belongs to a group of medications called SSRIs. This medicine may be used for other purposes; ask your health care provider or pharmacist if you have questions. COMMON BRAND NAME(S): Lexapro What should I tell my care team before I take this medication? They need to know if you have any of these conditions: Bipolar disorder or a family history of bipolar disorder Diabetes Glaucoma Heart disease Kidney disease Liver disease Receiving electroconvulsive therapy Seizures Suicidal thoughts, plans, or attempt by you or a family member An unusual or allergic reaction to escitalopram, other medications, foods, dyes, or preservatives Pregnant or trying to become pregnant Breastfeeding How should I use this medication? Take this medication by mouth with a glass of water. Follow the directions on the prescription label. You can take it with or without food. If it upsets your stomach, take it with food. Take your medication at regular intervals. Do not take it more often than directed. Do not stop taking this medication suddenly except upon the advice of your care team. Stopping this medication too quickly may cause serious side effects or your condition may worsen. A special MedGuide will be given to you by the pharmacist with each prescription and refill. Be sure to read this information carefully each time. Talk to your care team regarding the use of this medication in children. Special care may be  needed. Overdosage: If you think you have taken too much of this medicine contact a poison control center or emergency room at once. NOTE: This medicine is only for you. Do not share this medicine with others. What if I miss a dose? If you miss a dose, take it as soon as you can. If it is almost time for your next dose, take only that dose. Do not take double or extra doses. What may interact with this medication? Do not take this medication with any of the following: Certain medications for fungal infections like fluconazole, itraconazole, ketoconazole, posaconazole, voriconazole Cisapride Citalopram Dronedarone Linezolid MAOIs like Carbex, Eldepryl, Marplan, Nardil, and Parnate Methylene blue (injected into a vein) Pimozide Thioridazine This medication may also interact with the following: Alcohol Amphetamines Aspirin and aspirin-like medications Carbamazepine Certain medications for depression, anxiety, or psychotic disturbances Certain medications for migraine headache like almotriptan, eletriptan, frovatriptan, naratriptan, rizatriptan, sumatriptan, zolmitriptan Certain medications for sleep Certain medications that treat or prevent blood clots like warfarin, enoxaparin, dalteparin Cimetidine Diuretics Dofetilide Fentanyl Furazolidone Isoniazid Lithium Metoprolol NSAIDs, medications for pain and inflammation, like ibuprofen or naproxen Other medications that prolong the QT interval (cause an abnormal heart rhythm) Procarbazine Rasagiline Supplements like St. John's wort, kava kava, valerian Tramadol Tryptophan Ziprasidone This list may not describe all possible interactions. Give your health care provider a list of all the medicines, herbs, non-prescription drugs, or dietary supplements you use. Also tell them if you smoke, drink alcohol, or use illegal drugs. Some items may interact with your medicine. What should I watch for while using this medication? Tell your  care team if your symptoms do not get better or  if they get worse. Visit your care team for regular checks on your progress. Because it may take several weeks to see the full effects of this medication, it is important to continue your treatment as prescribed by your care team. Watch for new or worsening thoughts of suicide or depression. This includes sudden changes in mood, behaviors, or thoughts. These changes can happen at any time but are more common in the beginning of treatment or after a change in dose. Call your care team right away if you experience these thoughts or worsening depression. This medication may cause mood and behavior changes, such as anxiety, nervousness, irritability, hostility, restlessness, excitability, hyperactivity, or trouble sleeping. These changes can happen at any time but are more common in the beginning of treatment or after a change in dose. Call your care team right away if you notice any of these symptoms. This medication may affect your coordination, reaction time, or judgment. Do not drive or operate machinery until you know how this medication affects you. Sit up or stand slowly to reduce the risk of dizzy or fainting spells. Drinking alcohol with this medication can increase the risk of these side effects. Your mouth may get dry. Chewing sugarless gum or sucking hard candy and drinking plenty of water may help. Contact your care team if the problem does not go away or is severe. What side effects may I notice from receiving this medication? Side effects that you should report to your care team as soon as possible: Allergic reactions--skin rash, itching, hives, swelling of the face, lips, tongue, or throat Bleeding--bloody or black, tar-like stools, red or dark brown urine, vomiting blood or brown material that looks like coffee grounds, small, red or purple spots on skin, unusual bleeding or bruising Heart rhythm changes--fast or irregular heartbeat, dizziness,  feeling faint or lightheaded, chest pain, trouble breathing Low sodium level--muscle weakness, fatigue, dizziness, headache, confusion Serotonin syndrome--irritability, confusion, fast or irregular heartbeat, muscle stiffness, twitching muscles, sweating, high fever, seizure, chills, vomiting, diarrhea Sudden eye pain or change in vision such as blurry vision, seeing halos around lights, vision loss Thoughts of suicide or self-harm, worsening mood, feelings of depression Side effects that usually do not require medical attention (report to your care team if they continue or are bothersome): Change in sex drive or performance Diarrhea Excessive sweating Nausea Tremors or shaking Upset stomach This list may not describe all possible side effects. Call your doctor for medical advice about side effects. You may report side effects to FDA at 1-800-FDA-1088. Where should I keep my medication? Keep out of reach of children and pets. Store at room temperature between 15 and 30 degrees C (59 and 86 degrees F). Throw away any unused medication after the expiration date. NOTE: This sheet is a summary. It may not cover all possible information. If you have questions about this medicine, talk to your doctor, pharmacist, or health care provider.  2023 Elsevier/Gold Standard (2020-03-08 00:00:00)

## 2022-05-26 NOTE — Assessment & Plan Note (Signed)
PHQ 18 GAD 16  Will Start Lexapro 10 mg daily Discussed potential side effects, incl GI upset, sexual dysfunction, increased anxiety, and SI Discussed that it can take 6-8 weeks to reach full efficacy Contracted for safety - no SI/HI Discussed synergistic effects of medications and therapy   Follow up in 1 month

## 2022-05-26 NOTE — Assessment & Plan Note (Signed)
Taking Vitamin D oral once a week Will repeat lab in 3 months Encouraged to drink vitamin D rich diets

## 2022-05-26 NOTE — Assessment & Plan Note (Signed)
Controlled with Ubrelvy 100 mg OD as needed

## 2022-05-27 LAB — IRON,TIBC AND FERRITIN PANEL
Ferritin: 21 ng/mL (ref 15–150)
Iron Saturation: 23 % (ref 15–55)
Iron: 71 ug/dL (ref 27–159)
Total Iron Binding Capacity: 303 ug/dL (ref 250–450)
UIBC: 232 ug/dL (ref 131–425)

## 2022-05-27 LAB — VITAMIN B12: Vitamin B-12: 375 pg/mL (ref 232–1245)

## 2022-06-15 DIAGNOSIS — F4323 Adjustment disorder with mixed anxiety and depressed mood: Secondary | ICD-10-CM | POA: Diagnosis not present

## 2022-06-19 ENCOUNTER — Other Ambulatory Visit (HOSPITAL_COMMUNITY): Payer: Self-pay

## 2022-06-19 ENCOUNTER — Other Ambulatory Visit: Payer: Self-pay

## 2022-06-27 ENCOUNTER — Ambulatory Visit: Payer: 59 | Admitting: Physician Assistant

## 2022-07-04 ENCOUNTER — Other Ambulatory Visit (HOSPITAL_COMMUNITY): Payer: Self-pay

## 2022-07-04 DIAGNOSIS — F4323 Adjustment disorder with mixed anxiety and depressed mood: Secondary | ICD-10-CM | POA: Diagnosis not present

## 2022-07-10 ENCOUNTER — Other Ambulatory Visit (HOSPITAL_COMMUNITY): Payer: Self-pay

## 2022-07-10 ENCOUNTER — Other Ambulatory Visit: Payer: Self-pay

## 2022-07-12 ENCOUNTER — Other Ambulatory Visit: Payer: Self-pay

## 2022-07-18 DIAGNOSIS — F4323 Adjustment disorder with mixed anxiety and depressed mood: Secondary | ICD-10-CM | POA: Diagnosis not present

## 2022-08-01 DIAGNOSIS — F4323 Adjustment disorder with mixed anxiety and depressed mood: Secondary | ICD-10-CM | POA: Diagnosis not present

## 2022-08-29 ENCOUNTER — Telehealth: Payer: 59 | Admitting: Nurse Practitioner

## 2022-08-29 ENCOUNTER — Telehealth: Payer: Self-pay

## 2022-08-29 DIAGNOSIS — J329 Chronic sinusitis, unspecified: Secondary | ICD-10-CM

## 2022-08-29 DIAGNOSIS — B9789 Other viral agents as the cause of diseases classified elsewhere: Secondary | ICD-10-CM

## 2022-08-29 DIAGNOSIS — R062 Wheezing: Secondary | ICD-10-CM | POA: Diagnosis not present

## 2022-08-29 MED ORDER — ALBUTEROL SULFATE HFA 108 (90 BASE) MCG/ACT IN AERS: 2.0000 | INHALATION_SPRAY | Freq: Four times a day (QID) | RESPIRATORY_TRACT | 0 refills | Status: DC | PRN

## 2022-08-29 MED ORDER — IPRATROPIUM BROMIDE 0.03 % NA SOLN: 2.0000 | Freq: Two times a day (BID) | NASAL | 12 refills | Status: DC

## 2022-08-29 MED ORDER — BENZONATATE 100 MG PO CAPS: 100.0000 mg | ORAL_CAPSULE | Freq: Three times a day (TID) | ORAL | 0 refills | Status: DC | PRN

## 2022-08-29 NOTE — Progress Notes (Signed)
E-Visit for Cough  We are sorry that you are not feeling well.  Here is how we plan to help!  Based on your presentation I believe you most likely have A cough due to a virus.  This is called viral bronchitis and is best treated by rest, plenty of fluids and control of the cough.  You may use Ibuprofen or Tylenol as directed to help your symptoms.     In addition you may use A prescription cough medication called Tessalon Perles 100mg . You may take 1-2 capsules every 8 hours as needed for your cough.  We will also prescribe an Albuterol inhaler to help with your wheezing.   E-Visit for Sinus Problems  We are sorry that you are not feeling well.  Here is how we plan to help!  Based on what you have shared with me it looks like you have sinusitis.  Sinusitis is inflammation and infection in the sinus cavities of the head.  Based on your presentation I believe you most likely have Acute Viral Sinusitis.This is an infection most likely caused by a virus. There is not specific treatment for viral sinusitis other than to help you with the symptoms until the infection runs its course.  You may use an oral decongestant such as Mucinex D or if you have glaucoma or high blood pressure use plain Mucinex. Saline nasal spray help and can safely be used as often as needed for congestion, I have prescribed: Ipratropium Bromide nasal spray 0.03% 2 sprays in eah nostril 2-3 times a day  Meds ordered this encounter  Medications   ipratropium (ATROVENT) 0.03 % nasal spray    Sig: Place 2 sprays into both nostrils every 12 (twelve) hours.    Dispense:  30 mL    Refill:  12   benzonatate (TESSALON) 100 MG capsule    Sig: Take 1 capsule (100 mg total) by mouth 3 (three) times daily as needed.    Dispense:  30 capsule    Refill:  0   albuterol (VENTOLIN HFA) 108 (90 Base) MCG/ACT inhaler    Sig: Inhale 2 puffs into the lungs every 6 (six) hours as needed for wheezing or shortness of breath.    Dispense:  8 g     Refill:  0     Some authorities believe that zinc sprays or the use of Echinacea may shorten the course of your symptoms.  Sinus infections are not as easily transmitted as other respiratory infection, however we still recommend that you avoid close contact with loved ones, especially the very young and elderly.  Remember to wash your hands thoroughly throughout the day as this is the number one way to prevent the spread of infection!  Home Care: Only take medications as instructed by your medical team. Do not take these medications with alcohol. A steam or ultrasonic humidifier can help congestion.  You can place a towel over your head and breathe in the steam from hot water coming from a faucet. Avoid close contacts especially the very young and the elderly. Cover your mouth when you cough or sneeze. Always remember to wash your hands.   USE OF BRONCHODILATOR ("RESCUE") INHALERS: There is a risk from using your bronchodilator too frequently.  The risk is that over-reliance on a medication which only relaxes the muscles surrounding the breathing tubes can reduce the effectiveness of medications prescribed to reduce swelling and congestion of the tubes themselves.  Although you feel brief relief from the bronchodilator inhaler,  your asthma may actually be worsening with the tubes becoming more swollen and filled with mucus.  This can delay other crucial treatments, such as oral steroid medications. If you need to use a bronchodilator inhaler daily, several times per day, you should discuss this with your provider.  There are probably better treatments that could be used to keep your asthma under control.     HOME CARE Only take medications as instructed by your medical team. Complete the entire course of an antibiotic. Drink plenty of fluids and get plenty of rest. Avoid close contacts especially the very young and the elderly Cover your mouth if you cough or cough into your sleeve. Always  remember to wash your hands A steam or ultrasonic humidifier can help congestion.   GET HELP RIGHT AWAY IF: You develop worsening fever. You become short of breath You cough up blood. Your symptoms persist after you have completed your treatment plan MAKE SURE YOU  Understand these instructions. Will watch your condition. Will get help right away if you are not doing well or get worse.    Thank you for choosing an e-visit.  Your e-visit answers were reviewed by a board certified advanced clinical practitioner to complete your personal care plan. Depending upon the condition, your plan could have included both over the counter or prescription medications.  Please review your pharmacy choice. Make sure the pharmacy is open so you can pick up prescription now. If there is a problem, you may contact your provider through [employer] and have the prescription routed to another pharmacy.  Your safety is important to [place]. If you have drug allergies check your prescription carefully.   For the next 24 hours you can use MyChart to ask questions about today's visit, request a non-urgent call back, or ask for a work or school excuse. You will get an email in the next two days asking about your experience. I hope that your e-visit has been valuable and will speed your recovery.   I spent approximately 5 minutes reviewing the patient's history, current symptoms and coordinating their care today.

## 2022-08-29 NOTE — Telephone Encounter (Signed)
Pt called today to request a same day appointment for the following symptoms:cold symptoms. Unfortunately, our schedule is full and we have no openings between today or tomorrow. Pt was notified to go to Urgent Care or if they have MyChart they are able to login to do a e-visit with a [facility] Provider from home.

## 2022-10-11 ENCOUNTER — Other Ambulatory Visit: Payer: Self-pay | Admitting: Family Medicine

## 2022-10-11 ENCOUNTER — Other Ambulatory Visit: Payer: Self-pay

## 2022-10-11 ENCOUNTER — Ambulatory Visit (INDEPENDENT_AMBULATORY_CARE_PROVIDER_SITE_OTHER): Payer: 59 | Admitting: Family Medicine

## 2022-10-11 ENCOUNTER — Encounter: Payer: Self-pay | Admitting: Family Medicine

## 2022-10-11 VITALS — BP 98/60 | HR 76 | Temp 97.4°F | Resp 16 | Ht 61.0 in | Wt 150.0 lb

## 2022-10-11 DIAGNOSIS — Z0184 Encounter for antibody response examination: Secondary | ICD-10-CM

## 2022-10-11 DIAGNOSIS — Z23 Encounter for immunization: Secondary | ICD-10-CM | POA: Diagnosis not present

## 2022-10-11 DIAGNOSIS — Z0000 Encounter for general adult medical examination without abnormal findings: Secondary | ICD-10-CM

## 2022-10-11 MED ORDER — VITAMIN D (ERGOCALCIFEROL) 1.25 MG (50000 UNIT) PO CAPS
50000.0000 [IU] | ORAL_CAPSULE | ORAL | 2 refills | Status: DC
  Filled 2022-10-11: qty 5, 35d supply, fill #0

## 2022-10-11 NOTE — Progress Notes (Signed)
Subjective:  Patient ID: Natalie Marks, female    DOB: 01-26-1991  Age: 32 y.o. MRN: [id number]  Chief Complaint  Patient presents with   Annual Exam    HPI Well Adult Physical: Patient here for a comprehensive physical exam.The patient reports no problems Do you take any herbs or supplements that were not prescribed by a doctor? no Are you taking calcium supplements? no Are you taking aspirin daily? no  Encounter for general adult medical examination without abnormal findings  Physical ("At Risk" items are starred): Patient's last physical exam was 1 year ago .  Patient is not afflicted from Stress Incontinence and Urge Incontinence  Patient wears a seat belts Patient has smoke detectors and has carbon monoxide detectors. Patient practices appropriate gun safety. Patient wears sunscreen with extended sun exposure. Dental Care: biannual cleanings, brushes and flosses daily. Ophthalmology/Optometry: Annual visit.  Hearing loss: none Vision impairments: Glasses  Menarche: 13 by.o Menstrual History: reg LMP: 09/18/22 Pregnancy history: 3 Safe at home: yes  Self breast exams: sometimes  10 foot whisper test: PASS     10/11/2022   10:00 AM 05/26/2022   10:06 AM 04/27/2022    9:26 AM 01/06/2021    3:34 PM 08/19/2019    8:19 AM  Depression screen PHQ 2/9  Decreased Interest 1 1 2  0 0  Down, Depressed, Hopeless 1 3 1  0 0  PHQ - 2 Score 2 4 3  0 0  Altered sleeping 2 3 1     Tired, decreased energy 2 3 2     Change in appetite 0 3 2    Feeling bad or failure about yourself  0 2 1    Trouble concentrating 2 3 0    Moving slowly or fidgety/restless 0 0 0    Suicidal thoughts 0 0 0    PHQ-9 Score 8 18 9     Difficult doing work/chores Somewhat difficult Very difficult Somewhat difficult           12/19/2018   11:22 PM 12/20/2018    7:30 AM 01/06/2021    3:34 PM 04/27/2022    9:20 AM 10/11/2022   10:00 AM  Fall Risk  Falls in the past year?   0 0 0  Was there an injury with  Fall?   0 0 0  Fall Risk Category Calculator   0 0 0  Fall Risk Category (Retired)   Low    (RETIRED) Patient Fall Risk Level Low fall risk Low fall risk Low fall risk    Patient at Risk for Falls Due to   No Fall Risks No Fall Risks No Fall Risks  Fall risk Follow up   Falls evaluation completed Falls evaluation completed Falls evaluation completed    Social Hx   Social History   Socioeconomic History   Marital status: Legally Separated    Spouse name: Tim   Number of children: 3   Years of education: Not on file   Highest education level: Not on file  Occupational History   Occupation: Psychologist, sport and exercise  Tobacco Use   Smoking status: Never   Smokeless tobacco: Never  Vaping Use   Vaping status: Never Used  Substance and Sexual Activity   Alcohol use: No   Drug use: No   Sexual activity: Yes    Partners: Male    Birth control/protection: I.U.D.  Other Topics Concern   Not on file  Social History Narrative   Not on file   Social Determinants of Health  Financial Resource Strain: Low Risk  (10/11/2022)   Overall Financial Resource Strain (CARDIA)    Difficulty of Paying Living Expenses: Not hard at all  Food Insecurity: No Food Insecurity (10/11/2022)   Hunger Vital Sign    Worried About Running Out of Food in the Last Year: Never true    Ran Out of Food in the Last Year: Never true  Transportation Needs: No Transportation Needs (06/14/2021)   PRAPARE - Administrator, Civil Service (Medical): No    Lack of Transportation (Non-Medical): No  Physical Activity: Inactive (10/11/2022)   Exercise Vital Sign    Days of Exercise per Week: 0 days    Minutes of Exercise per Session: 0 min  Stress: No Stress Concern Present (10/11/2022)   [employer] of Occupational Health - Occupational Stress Questionnaire    Feeling of Stress : Not at all  Social Connections: Moderately Isolated (10/11/2022)   Social Connection and Isolation Panel [NHANES]    Frequency of  Communication with Friends and Family: Twice a week    Frequency of Social Gatherings with Friends and Family: Twice a week    Attends Religious Services: More than 4 times per year    Active Member of [employer] or Organizations: No    Attends Banker Meetings: Never    Marital Status: Separated   Past Medical History:  Diagnosis Date   Anemia    Generalized anxiety disorder 08/19/2019   Headache(784.0)    Migraine without aura, not intractable, without status migrainosus 08/19/2019   Other urticaria 08/19/2019   Postpartum care following vaginal delivery (4/6) 07/08/2015   Past Surgical History:  Procedure Laterality Date   NO PAST SURGERIES      Family History  Problem Relation Age of Onset   Diabetes Maternal Grandmother    Alcohol abuse Neg Hx    Arthritis Neg Hx    Asthma Neg Hx    Birth defects Neg Hx    Cancer Neg Hx    COPD Neg Hx    Depression Neg Hx    Drug abuse Neg Hx    Early death Neg Hx    Hearing loss Neg Hx    Heart disease Neg Hx    Hyperlipidemia Neg Hx    Hypertension Neg Hx    Kidney disease Neg Hx    Learning disabilities Neg Hx    Mental illness Neg Hx    Mental retardation Neg Hx    Miscarriages / Stillbirths Neg Hx    Stroke Neg Hx    Vision loss Neg Hx     Review of Systems  Constitutional:  Negative for chills, fatigue and fever.  HENT:  Negative for congestion, ear pain, rhinorrhea and sore throat.   Respiratory:  Negative for cough and shortness of breath.   Cardiovascular:  Negative for chest pain.  Gastrointestinal:  Negative for abdominal pain, constipation, diarrhea, nausea and vomiting.  Genitourinary:  Negative for dysuria and urgency.  Musculoskeletal:  Negative for back pain and myalgias.  Neurological:  Negative for dizziness, weakness, light-headedness and headaches.  Psychiatric/Behavioral:  Negative for dysphoric mood. The patient is not nervous/anxious.      Objective:  BP 98/60   Pulse 76   Temp (!) 97.4 F  (36.3 C)   Resp 16   Ht 5\' 1"  (1.549 m)   Wt 150 lb (68 kg)   LMP 09/18/2022 (Approximate)   SpO2 99%   BMI 28.34 kg/m  10/11/2022    9:48 AM 05/26/2022    9:59 AM 04/27/2022    9:19 AM  BP/Weight  Systolic BP 98 96 100  Diastolic BP 60 60 70  Wt. (Lbs) 150 146.6 144  BMI 28.34 kg/m2 27.7 kg/m2 26.34 kg/m2    Physical Exam Vitals reviewed.  Constitutional:      Appearance: Normal appearance.  HENT:     Right Ear: Tympanic membrane normal.     Left Ear: Tympanic membrane normal.     Nose: Nose normal.     Mouth/Throat:     Pharynx: No oropharyngeal exudate or posterior oropharyngeal erythema.  Eyes:     Conjunctiva/sclera: Conjunctivae normal.  Neck:     Vascular: No carotid bruit.  Cardiovascular:     Rate and Rhythm: Normal rate and regular rhythm.     Pulses: Normal pulses.     Heart sounds: Normal heart sounds.  Pulmonary:     Effort: Pulmonary effort is normal.     Breath sounds: Normal breath sounds.  Abdominal:     General: Bowel sounds are normal.     Palpations: There is no mass.     Tenderness: There is no abdominal tenderness.  Musculoskeletal:     Cervical back: Normal range of motion.  Skin:    Findings: No lesion.  Neurological:     Mental Status: She is alert and oriented to person, place, and time.  Psychiatric:        Mood and Affect: Mood normal.        Behavior: Behavior normal.     Lab Results  Component Value Date   WBC 5.4 04/27/2022   HGB 12.8 04/27/2022   HCT 38.5 04/27/2022   PLT 225 04/27/2022   GLUCOSE 85 04/27/2022   CHOL 160 10/11/2022   TRIG 121 10/11/2022   HDL 46 10/11/2022   LDLCALC 92 10/11/2022   ALT 36 (H) 04/27/2022   AST 26 04/27/2022   NA 138 04/27/2022   K 4.2 04/27/2022   CL 102 04/27/2022   CREATININE 0.69 04/27/2022   BUN 9 04/27/2022   CO2 22 04/27/2022   TSH 1.480 04/27/2022      Assessment & Plan:  Routine general medical examination at a health care facility Assessment & Plan: Things  to do to keep yourself healthy  - Exercise at least 30-45 minutes a day, 3-4 days a week.  - Eat a low-fat diet with lots of fruits and vegetables, up to 7-9 servings per day.  - Seatbelts can save your life. Wear them always.  - Smoke detectors on every level of your home, check batteries every year.  - Eye Doctor - have an eye exam every 1-2 years  - Safe sex - if you may be exposed to STDs, use a condom.  - Alcohol -  If you drink, do it moderately, less than 2 drinks per day.  - Health Care Power of Attorney. Choose someone to speak for you if you are not able.  - Depression is common in our stressful world.If you're feeling down or losing interest in things you normally enjoy, please come in for a visit.  - Violence - If anyone is threatening or hurting you, please call immediately.   Orders: -     Vitamin D (Ergocalciferol); Take 1 capsule (50,000 Units total) by mouth every 7 (seven) days.  Dispense: 5 capsule; Refill: 2 -     VITAMIN D 25 Hydroxy (Vit-D Deficiency, Fractures) -  Lipid panel  Immunization due -     Tdap vaccine greater than or equal to 7yo IM  Immunity status testing -     Hepatitis B surface antibody,quantitative -     QuantiFERON-TB Gold Plus -     Measles/Mumps/Rubella Immunity -     Varicella zoster antibody, IgG     Body mass index is 28.34 kg/m.   This is a list of the screening recommended for you and due dates:  Health Maintenance  Topic Date Due   Flu Shot  11/02/2022   Pap Smear  10/28/2023   Pap Smear  10/28/2023   DTaP/Tdap/Td vaccine (3 - Td or Tdap) 10/10/2032   HIV Screening  Completed   HPV Vaccine  Aged Out   COVID-19 Vaccine  Discontinued   Hepatitis C Screening  Discontinued     Meds ordered this encounter  Medications   Vitamin D, Ergocalciferol, (DRISDOL) 1.25 MG (50000 UNIT) CAPS capsule    Sig: Take 1 capsule (50,000 Units total) by mouth every 7 (seven) days.    Dispense:  5 capsule    Refill:  2    Follow-up:  Return in about 1 year (around 10/11/2023) for cpe.  An After Visit Summary was printed and given to the patient.  Blane Ohara, MD [facility] [phone]

## 2022-10-12 LAB — LIPID PANEL
Chol/HDL Ratio: 3.5 ratio (ref 0.0–4.4)
Cholesterol, Total: 160 mg/dL (ref 100–199)
HDL: 46 mg/dL (ref >39)
LDL Chol Calc (NIH): 92 mg/dL (ref 0–99)
Triglycerides: 121 mg/dL (ref 0–149)
VLDL Cholesterol Cal: 22 mg/dL (ref 5–40)

## 2022-10-12 LAB — MEASLES/MUMPS/RUBELLA IMMUNITY
MUMPS ABS, IGG: 27.2 AU/mL (ref >10.9)
RUBEOLA AB, IGG: <13.5 AU/mL — ABNORMAL LOW (ref >16.4)
Rubella Antibodies, IGG: 2.45 index (ref >0.99)

## 2022-10-12 LAB — HEPATITIS B SURFACE ANTIBODY, QUANTITATIVE: Hepatitis B Surf Ab Quant: <3.5 m[IU]/mL — ABNORMAL LOW

## 2022-10-12 LAB — VITAMIN D 25 HYDROXY (VIT D DEFICIENCY, FRACTURES): Vit D, 25-Hydroxy: 32.2 ng/mL (ref 30.0–100.0)

## 2022-10-12 LAB — VARICELLA ZOSTER ANTIBODY, IGG: Varicella zoster IgG: 1043 index (ref >165)

## 2022-10-12 NOTE — Assessment & Plan Note (Signed)

## 2022-10-15 LAB — QUANTIFERON-TB GOLD PLUS
QuantiFERON Mitogen Value: >10 IU/mL
QuantiFERON Nil Value: 0.03 IU/mL
QuantiFERON TB1 Ag Value: 0.05 IU/mL
QuantiFERON TB2 Ag Value: 0.05 IU/mL
QuantiFERON-TB Gold Plus: NEGATIVE

## 2022-10-16 ENCOUNTER — Telehealth: Payer: Self-pay

## 2022-10-16 NOTE — Telephone Encounter (Addendum)
Patient made aware TB Quantiferon was Negative, and paperwork is ready for pick up   ----- Message from Blane Ohara sent at 10/15/2022 11:45 PM EDT ----- Regarding: paperwork Complete patient's paperwork.  Katie had it. Dr. Sedalia Muta ----- Message ----- From: Interface, [facility] Lab Results In Sent: 10/15/2022  12:35 AM EDT To: Blane Ohara, MD

## 2022-11-07 ENCOUNTER — Telehealth: Payer: Self-pay

## 2022-11-07 NOTE — Telephone Encounter (Signed)
PA submitted and approved via covermymeds for [employer].

## 2022-11-14 DIAGNOSIS — Z792 Long term (current) use of antibiotics: Secondary | ICD-10-CM | POA: Diagnosis not present

## 2023-02-26 ENCOUNTER — Ambulatory Visit (HOSPITAL_BASED_OUTPATIENT_CLINIC_OR_DEPARTMENT_OTHER): Payer: 59

## 2023-02-28 ENCOUNTER — Ambulatory Visit (HOSPITAL_BASED_OUTPATIENT_CLINIC_OR_DEPARTMENT_OTHER): Payer: 59

## 2023-09-24 ENCOUNTER — Other Ambulatory Visit

## 2023-09-24 ENCOUNTER — Telehealth: Payer: Self-pay

## 2023-09-24 DIAGNOSIS — Z111 Encounter for screening for respiratory tuberculosis: Secondary | ICD-10-CM

## 2023-09-24 NOTE — Telephone Encounter (Signed)
 I spoke with the patient. Patient notified that it appears that she is showing active for [place] [employer] [place]. Patient notified that [facility] would bill the [employer]. The patient has been scheduled for this morning to come in for the lab work. Patient stated that she needs this test done for school. Clinical staff has been notified.

## 2023-09-24 NOTE — Telephone Encounter (Signed)
 Patient notified that per Dr. Sherre  we have to see you once a year to be able to order medicines or to order lab work. It has not quite been a year yet which is why Dr. Sherre approved it. Patient notified that we can get her scheduled for a CPE when she comes in or she can call back to schedule.

## 2023-09-24 NOTE — Telephone Encounter (Signed)
 Copied from CRM [id number]. Topic: General - Billing Inquiry >> Sep 24, 2023  9:40 AM Everette C wrote: Reason for CRM: The patient would like to be contacted by a member of administrative staff to discuss the potential out of pocket cost for lab testing (specifically QFT-Gold) Please contact the patient further if/when possible

## 2023-09-24 NOTE — Addendum Note (Signed)
 Addended by: RANEE KATO I on: 09/24/2023 10:39 AM   Modules accepted: Orders

## 2023-09-27 ENCOUNTER — Ambulatory Visit: Payer: Self-pay | Admitting: Family Medicine

## 2023-09-27 LAB — QUANTIFERON-TB GOLD PLUS
QuantiFERON Mitogen Value: >10 [IU]/mL
QuantiFERON Nil Value: 0.03 [IU]/mL
QuantiFERON TB1 Ag Value: 0.05 [IU]/mL
QuantiFERON TB2 Ag Value: 0.05 [IU]/mL
QuantiFERON-TB Gold Plus: NEGATIVE

## 2023-11-28 ENCOUNTER — Encounter (HOSPITAL_BASED_OUTPATIENT_CLINIC_OR_DEPARTMENT_OTHER): Payer: Self-pay

## 2023-11-28 ENCOUNTER — Ambulatory Visit (HOSPITAL_BASED_OUTPATIENT_CLINIC_OR_DEPARTMENT_OTHER)
Admission: RE | Admit: 2023-11-28 | Discharge: 2023-11-28 | Disposition: A | Discharge status: Home / Self Care | Source: Ambulatory Visit | Attending: Family Medicine | Admitting: Family Medicine

## 2023-11-28 VITALS — BP 128/84 | HR 86 | Temp 98.2°F | Resp 18

## 2023-11-28 DIAGNOSIS — J029 Acute pharyngitis, unspecified: Secondary | ICD-10-CM

## 2023-11-28 DIAGNOSIS — R110 Nausea: Secondary | ICD-10-CM

## 2023-11-28 DIAGNOSIS — J069 Acute upper respiratory infection, unspecified: Secondary | ICD-10-CM | POA: Diagnosis not present

## 2023-11-28 DIAGNOSIS — R52 Pain, unspecified: Secondary | ICD-10-CM | POA: Diagnosis not present

## 2023-11-28 LAB — POCT RAPID STREP A (OFFICE): Rapid Strep A Screen: NEGATIVE

## 2023-11-28 LAB — POC SOFIA SARS ANTIGEN FIA: SARS Coronavirus 2 Ag: NEGATIVE

## 2023-11-28 MED ORDER — ONDANSETRON 4 MG PO TBDP: 4.0000 mg | ORAL_TABLET | Freq: Three times a day (TID) | ORAL | 0 refills | Status: AC | PRN

## 2023-11-28 NOTE — Discharge Instructions (Addendum)
 Viral upper respiratory infection with cough, body aches, sore throat and nausea without vomiting: Symptoms just started this morning.  COVID and strep test are negative.  Provided ondansetron , 4 mg, ODT, every 8 hours if needed for nausea.  Patient needs to stay hydrated and eating is less important for the next couple of days.  Patient needs to stay home due to her symptoms and retest for COVID tomorrow around 2 to 3:00 in the afternoon.  If symptoms worsen she may be a candidate for Paxlovid but for now she just needs to get plenty of fluids and rest.  Provided work excuse.  Follow-up here as needed.

## 2023-11-28 NOTE — ED Provider Notes (Signed)
 [facility]    CSN: [id number] Arrival date & time: 11/28/23  1841      History   Chief Complaint Chief Complaint  Patient presents with   Sore Throat   Generalized Body Aches   Otalgia    HPI Natalie Marks is a 33 y.o. female.   Patient reports acute onset of symptoms midmorning today (11/28/2023).  She is reporting ear pain, sore throat, body aches, malaise, dizziness.  She felt perfectly normal yesterday.  She thinks she may have even had a low-grade fever but did not take it with a thermometer.  She is taken Zyrtec and gargled with salt water but feels worse not better as the day goes on.  She also is having nausea but no vomiting.  She is worried that she might get dehydrated because she really does not feel like she could drink much of anything right now.   Sore Throat Pertinent negatives include no chest pain, no abdominal pain and no shortness of breath.  Otalgia Associated symptoms: congestion, cough, fever, rhinorrhea and sore throat   Associated symptoms: no abdominal pain, no diarrhea, no rash and no vomiting     Past Medical History:  Diagnosis Date   Anemia    Generalized anxiety disorder 08/19/2019   Headache(784.0)    Migraine without aura, not intractable, without status migrainosus 08/19/2019   Other urticaria 08/19/2019   Postpartum care following vaginal delivery (4/6) 07/08/2015    Patient Active Problem List   Diagnosis Date Noted   Other fatigue 05/26/2022   Vitamin D  deficiency 05/26/2022   Migraine without aura, not intractable, without status migrainosus 08/19/2019   Generalized anxiety disorder 08/19/2019   Other urticaria 08/19/2019   Routine general medical examination at a health care facility 12/19/2018    Past Surgical History:  Procedure Laterality Date   NO PAST SURGERIES      OB History     Gravida  3   Para  3   Term  3   Preterm  0   AB  0   Living  3      SAB  0   IAB  0   Ectopic  0   Multiple   0   Live Births  3            Home Medications    Prior to Admission medications   Medication Sig Start Date End Date Taking? Authorizing Provider  ondansetron  (ZOFRAN -ODT) 4 MG disintegrating tablet Take 1 tablet (4 mg total) by mouth every 8 (eight) hours as needed for nausea or vomiting. 11/28/23  Yes Ival Domino, FNP  Vitamin D , Ergocalciferol , (DRISDOL ) 1.25 MG (50000 UNIT) CAPS capsule Take 1 capsule (50,000 Units total) by mouth every 7 (seven) days. 10/11/22   CoxAbigail, MD    Family History Family History  Problem Relation Age of Onset   Diabetes Maternal Grandmother    Alcohol abuse Neg Hx    Arthritis Neg Hx    Asthma Neg Hx    Birth defects Neg Hx    Cancer Neg Hx    COPD Neg Hx    Depression Neg Hx    Drug abuse Neg Hx    Early death Neg Hx    Hearing loss Neg Hx    Heart disease Neg Hx    Hyperlipidemia Neg Hx    Hypertension Neg Hx    Kidney disease Neg Hx    Learning disabilities Neg Hx    Mental illness  Neg Hx    Mental retardation Neg Hx    Miscarriages / Stillbirths Neg Hx    Stroke Neg Hx    Vision loss Neg Hx     Social History Social History   Tobacco Use   Smoking status: Never   Smokeless tobacco: Never  Vaping Use   Vaping status: Never Used  Substance Use Topics   Alcohol use: No   Drug use: No     Allergies   Macrobid [nitrofurantoin macrocrystal]   Review of Systems Review of Systems  Constitutional:  Positive for fatigue and fever. Negative for chills.  HENT:  Positive for congestion, ear pain, postnasal drip, rhinorrhea and sore throat.   Eyes:  Negative for pain and visual disturbance.  Respiratory:  Positive for cough. Negative for shortness of breath.   Cardiovascular:  Negative for chest pain and palpitations.  Gastrointestinal:  Negative for abdominal pain, constipation, diarrhea, nausea and vomiting.  Genitourinary:  Negative for dysuria and hematuria.  Musculoskeletal:  Positive for arthralgias. Negative  for back pain.  Skin:  Negative for color change and rash.  Neurological:  Positive for dizziness and weakness. Negative for seizures and syncope.  All other systems reviewed and are negative.    Physical Exam Triage Vital Signs ED Triage Vitals  Encounter Vitals Group     BP 11/28/23 1854 128/84     Girls Systolic BP Percentile --      Girls Diastolic BP Percentile --      Boys Systolic BP Percentile --      Boys Diastolic BP Percentile --      Pulse Rate 11/28/23 1854 86     Resp 11/28/23 1854 18     Temp 11/28/23 1854 98.2 F (36.8 C)     Temp Source 11/28/23 1854 Oral     SpO2 11/28/23 1854 98 %     Weight --      Height --      Head Circumference --      Peak Flow --      Pain Score 11/28/23 1852 6     Pain Loc --      Pain Education --      Exclude from Growth Chart --    No data found.  Updated Vital Signs BP 128/84 (BP Location: Right Arm)   Pulse 86   Temp 98.2 F (36.8 C) (Oral)   Resp 18   LMP 10/02/2023 (Approximate)   SpO2 98%   Visual Acuity Right Eye Distance:   Left Eye Distance:   Bilateral Distance:    Right Eye Near:   Left Eye Near:    Bilateral Near:     Physical Exam Vitals and nursing note reviewed.  Constitutional:      General: She is not in acute distress.    Appearance: She is well-developed. She is ill-appearing. She is not toxic-appearing or diaphoretic.  HENT:     Head: Normocephalic and atraumatic.     Right Ear: Hearing, tympanic membrane, ear canal and external ear normal.     Left Ear: Hearing, tympanic membrane, ear canal and external ear normal.     Nose: Congestion and rhinorrhea present. Rhinorrhea is clear.     Right Sinus: No maxillary sinus tenderness or frontal sinus tenderness.     Left Sinus: No maxillary sinus tenderness or frontal sinus tenderness.     Mouth/Throat:     Lips: Pink.     Mouth: Mucous membranes are moist.  Pharynx: Uvula midline. Posterior oropharyngeal erythema present. No oropharyngeal  exudate.     Tonsils: No tonsillar exudate (Tonsils are enlarged and erythematous but no active).  Eyes:     Conjunctiva/sclera: Conjunctivae normal.     Pupils: Pupils are equal, round, and reactive to light.  Cardiovascular:     Rate and Rhythm: Normal rate and regular rhythm.     Heart sounds: S1 normal and S2 normal. No murmur heard. Pulmonary:     Effort: Pulmonary effort is normal. No respiratory distress.     Breath sounds: Normal breath sounds. No decreased breath sounds, wheezing, rhonchi or rales.  Abdominal:     General: Bowel sounds are normal.     Palpations: Abdomen is soft.     Tenderness: There is no abdominal tenderness.  Musculoskeletal:        General: No swelling.     Cervical back: Neck supple.  Lymphadenopathy:     Head:     Right side of head: No submental, submandibular, tonsillar, preauricular or posterior auricular adenopathy.     Left side of head: No submental, submandibular, tonsillar, preauricular or posterior auricular adenopathy.     Cervical: Cervical adenopathy present.     Right cervical: Superficial cervical adenopathy present.     Left cervical: Superficial cervical adenopathy present.  Skin:    General: Skin is warm and dry.     Capillary Refill: Capillary refill takes less than 2 seconds.     Findings: No rash.  Neurological:     Mental Status: She is alert and oriented to person, place, and time.  Psychiatric:        Mood and Affect: Mood normal.      UC Treatments / Results  Labs (all labs ordered are listed, but only abnormal results are displayed) Labs Reviewed  POCT RAPID STREP A (OFFICE) - Normal  POC SOFIA SARS ANTIGEN FIA - Normal    EKG   Radiology No results found.  Procedures Procedures (including critical care time)  Medications Ordered in UC Medications - No data to display  Initial Impression / Assessment and Plan / UC Course  I have reviewed the triage vital signs and the nursing notes.  Pertinent labs &  imaging results that were available during my care of the patient were reviewed by me and considered in my medical decision making (see chart for details).  Plan of Care: Viral upper respiratory infection with sore throat body aches and nausea without vomiting: Get plenty of fluids and rest.  Encouraged to take a COVID test tomorrow afternoon.  Use ondansetron , 4 mg ODT, every 8 hours, if needed for nausea.  Eating is not very important for the next couple of days but patient needs to get and stay hydrated.    Work excuse provided follow-up if symptoms do not improve, worsen or new symptoms occur  I reviewed the plan of care with the patient and/or the patient's guardian.  The patient and/or guardian had time to ask questions and acknowledged that the questions were answered.  I provided instruction on symptoms or reasons to return here or to go to an ER, if symptoms/condition did not improve, worsened or if new symptoms occurred.  Final Clinical Impressions(s) / UC Diagnoses   Final diagnoses:  Sore throat  Generalized body aches  Viral URI with cough  Nausea without vomiting     Discharge Instructions      Viral upper respiratory infection with cough, body aches, sore throat and nausea  without vomiting: Symptoms just started this morning.  COVID and strep test are negative.  Provided ondansetron , 4 mg, ODT, every 8 hours if needed for nausea.  Patient needs to stay hydrated and eating is less important for the next couple of days.  Patient needs to stay home due to her symptoms and retest for COVID tomorrow around 2 to 3:00 in the afternoon.  If symptoms worsen she may be a candidate for Paxlovid but for now she just needs to get plenty of fluids and rest.  Provided work excuse.  Follow-up here as needed.     ED Prescriptions     Medication Sig Dispense Auth. Provider   ondansetron  (ZOFRAN -ODT) 4 MG disintegrating tablet Take 1 tablet (4 mg total) by mouth every 8 (eight) hours as  needed for nausea or vomiting. 20 tablet Shaneika Rossa, FNP      PDMP not reviewed this encounter.   Ival Domino, FNP 11/28/23 [place]

## 2023-11-28 NOTE — ED Triage Notes (Signed)
 Pt c/o sore throat, ear pain, body aches, dizziness started today she reports she felt fine yesterday she took zyrtec and gargled salt water but did not feel any better.

## 2023-12-27 ENCOUNTER — Other Ambulatory Visit (HOSPITAL_BASED_OUTPATIENT_CLINIC_OR_DEPARTMENT_OTHER): Payer: Self-pay

## 2023-12-27 MED ORDER — FLUBLOK 0.5 ML IM SOSY
0.5000 mL | PREFILLED_SYRINGE | Freq: Once | INTRAMUSCULAR | 0 refills | Status: AC
  Filled 2023-12-27: qty 0.5, 1d supply, fill #0

## 2023-12-31 ENCOUNTER — Ambulatory Visit

## 2023-12-31 VITALS — BP 108/88 | HR 99 | Temp 97.9°F | Ht 61.0 in | Wt 156.0 lb

## 2023-12-31 DIAGNOSIS — E559 Vitamin D deficiency, unspecified: Secondary | ICD-10-CM | POA: Diagnosis not present

## 2023-12-31 DIAGNOSIS — G43009 Migraine without aura, not intractable, without status migrainosus: Secondary | ICD-10-CM | POA: Diagnosis not present

## 2023-12-31 DIAGNOSIS — R519 Headache, unspecified: Secondary | ICD-10-CM | POA: Insufficient documentation

## 2023-12-31 DIAGNOSIS — R5383 Other fatigue: Secondary | ICD-10-CM

## 2023-12-31 NOTE — Patient Instructions (Signed)
  VISIT SUMMARY: Today, we discussed your recurrent episodes of fatigue, head pressure, and low-grade fever that have been occurring monthly for the past three months. We also reviewed your history of menstrual irregularity, allergic rhinitis, and vitamin D  deficiency.  YOUR PLAN: RECURRENT EPISODES OF FATIGUE, HEAD PRESSURE, AND LOW-GRADE FEVER: You have been experiencing these symptoms monthly, which may be related to hormonal fluctuations or other systemic issues. -We will order several blood tests, including a complete blood count (CBC), metabolic profile, thyroid  function tests, vitamin B12 and folic acid  levels, vitamin D  level, and erythrocyte sedimentation rate (ESR). -Please keep a symptom diary to track patterns and potential triggers. -Check your blood pressure during symptomatic episodes.  ALLERGIC RHINITIS: Your chronic nasal congestion is likely due to seasonal allergies. -Continue taking Zyrtec as needed for your allergies.  VITAMIN D  DEFICIENCY: You have a known vitamin D  deficiency, which may contribute to your fatigue. -We will order a vitamin D  level test to assess your current levels.                      Contains text generated by [employer].                                 Contains text generated by [employer].

## 2023-12-31 NOTE — Assessment & Plan Note (Addendum)
 Recurrent episodes of fatigue, head pressure, and low-grade fever occurring monthly for the past three months, with associated nausea and nasal congestion. Symptoms follow a cyclical pattern, potentially related to menstrual cycles. Differential diagnosis includes hormonal fluctuations, anemia, vitamin deficiencies, or other systemic issues. Previous tests for COVID and strep were negative. Symptoms resolve within a few days each month. - Order CBC with differential - Order metabolic profile - Order thyroid  function tests - Order vitamin B12 and folic acid  levels - Order vitamin D  level - Order ESR - Advise keeping a symptom diary to track patterns and potential triggers - Advise checking blood pressure during symptomatic episodes  OVERALL, THE ETIOLOGY OF HER VAGUE SYMPTOMS IS UNCLEAR. DOES NOT APPEAR TO BE PMDD OR PMS. IF ALL THE BLOOD WORK COMES BACK NORMAL AND IF SHE CONTINUES TO HAVE THIS PATTERN, COULD CONSIDER EITHER A COMBINED BIRTH CONTROL PILL TO REGULARIZE HER HORMONES OR CONSIDER A LOW DOSE SSRI FOR POSSIBLE ANXIETY OR PMDD.  Orders:   CBC with Differential/Platelet   Comprehensive metabolic panel with GFR   Sedimentation rate

## 2023-12-31 NOTE — Progress Notes (Signed)
 Acute Office Visit  Subjective:    Patient ID: Natalie Marks, female    DOB: [birth year], 33 y.o.   MRN: [id number]  No chief complaint on file.   HPI: Patient is in today for   Discussed the use of AI scribe software for clinical note transcription with the patient, who gave verbal consent to proceed.   History of Present Illness   Natalie Marks is a 33 year old female who presents with recurrent episodes of fatigue, head pressure, and low-grade fever.  Recurrent constitutional and neurologic symptoms - Recurrent episodes of fatigue, head pressure, and low-grade fever for the past three months - Episodes occur approximately once a month and last two to four days - Head pressure described as a sensation of the head feeling like it is going to explode, with a sensation of blood rushing through the ears - Associated symptoms during episodes include extreme fatigue, body aches, chills, and low-grade fever - Symptoms resolve spontaneously after a few days - No chest pain, shortness of breath, or palpitations - Multiple tests for COVID-19 and other infections, including strep, have been negative - Visited urgent care during one episode; no blood work performed at that time  Menstrual irregularity and gynecologic history - Episodes appear to coincide with menstrual cycle - Menstrual periods are irregular, sometimes long and heavy, other times short - 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 - Copper  IUD in place - No use of hormonal birth control due to prior adverse effects with NuvaRing  Allergic and migraine symptoms - Nasal congestion attributed to seasonal allergies - History of migraines, but current head pressure does not resemble typical migraine - Uses Zyrtec for allergies - Uses Zofran  for nausea associated with episodes - Uses a tension relief medication similar to Excedrin for migraines  Nutritional deficiency - Known vitamin D   deficiency - No recent blood work to assess current vitamin D  levels        Past Medical History:  Diagnosis Date   Anemia    Generalized anxiety disorder 08/19/2019   Headache(784.0)    Migraine without aura, not intractable, without status migrainosus 08/19/2019   Other urticaria 08/19/2019   Postpartum care following vaginal delivery (4/6) 07/08/2015    Past Surgical History:  Procedure Laterality Date   NO PAST SURGERIES      Family History  Problem Relation Age of Onset   Diabetes Maternal Grandmother    Alcohol abuse Neg Hx    Arthritis Neg Hx    Asthma Neg Hx    Birth defects Neg Hx    Cancer Neg Hx    COPD Neg Hx    Depression Neg Hx    Drug abuse Neg Hx    Early death Neg Hx    Hearing loss Neg Hx    Heart disease Neg Hx    Hyperlipidemia Neg Hx    Hypertension Neg Hx    Kidney disease Neg Hx    Learning disabilities Neg Hx    Mental illness Neg Hx    Mental retardation Neg Hx    Miscarriages / Stillbirths Neg Hx    Stroke Neg Hx    Vision loss Neg Hx     Social History   Socioeconomic History   Marital status: Legally Separated    Spouse name: Tim   Number of children: 3   Years of education: Not on file   Highest education level: Not on file  Occupational History   Occupation: Engineer, civil (consulting)  Tech  Tobacco Use   Smoking status: Never   Smokeless tobacco: Never  Vaping Use   Vaping status: Never Used  Substance and Sexual Activity   Alcohol use: No   Drug use: No   Sexual activity: Yes    Partners: Male    Birth control/protection: I.U.D.  Other Topics Concern   Not on file  Social History Narrative   Not on file   Social Drivers of Health   Financial Resource Strain: Low Risk  (10/11/2022)   Overall Financial Resource Strain (CARDIA)    Difficulty of Paying Living Expenses: Not hard at all  Food Insecurity: No Food Insecurity (10/11/2022)   Hunger Vital Sign    Worried About Running Out of Food in the Last Year: Never true    Ran Out of Food in  the Last Year: Never true  Transportation Needs: No Transportation Needs (06/14/2021)   PRAPARE - Administrator, Civil Service (Medical): No    Lack of Transportation (Non-Medical): No  Physical Activity: Inactive (10/11/2022)   Exercise Vital Sign    Days of Exercise per Week: 0 days    Minutes of Exercise per Session: 0 min  Stress: No Stress Concern Present (10/11/2022)   [employer] of Occupational Health - Occupational Stress Questionnaire    Feeling of Stress : Not at all  Social Connections: Moderately Isolated (10/11/2022)   Social Connection and Isolation Panel    Frequency of Communication with Friends and Family: Twice a week    Frequency of Social Gatherings with Friends and Family: Twice a week    Attends Religious Services: More than 4 times per year    Active Member of [employer] or Organizations: No    Attends Banker Meetings: Never    Marital Status: Separated  Intimate Partner Violence: Not At Risk (06/14/2021)   Humiliation, Afraid, Rape, and Kick questionnaire    Fear of Current or Ex-Partner: No    Emotionally Abused: No    Physically Abused: No    Sexually Abused: No    Outpatient Medications Prior to Visit  Medication Sig Dispense Refill   cetirizine (ZYRTEC) 10 MG chewable tablet Chew 10 mg by mouth daily.     ondansetron  (ZOFRAN -ODT) 4 MG disintegrating tablet Take 1 tablet (4 mg total) by mouth every 8 (eight) hours as needed for nausea or vomiting. 20 tablet 0   Vitamin D , Ergocalciferol , (DRISDOL ) 1.25 MG (50000 UNIT) CAPS capsule Take 1 capsule (50,000 Units total) by mouth every 7 (seven) days. (Patient not taking: Reported on 12/31/2023) 5 capsule 2   No facility-administered medications prior to visit.    Allergies  Allergen Reactions   Macrobid [Nitrofurantoin Macrocrystal] Hives    Review of Systems  Constitutional:  Positive for fatigue. Negative for chills and fever.  HENT:  Negative for congestion, ear pain, sinus  pressure and sore throat.   Respiratory:  Negative for cough and shortness of breath.   Cardiovascular:  Negative for chest pain.  Gastrointestinal:  Negative for abdominal pain, constipation, diarrhea, nausea and vomiting.  Genitourinary:  Positive for menstrual problem. Negative for dysuria and frequency.  Musculoskeletal:  Negative for arthralgias, back pain and myalgias.  Neurological:  Positive for headaches. Negative for dizziness.  Psychiatric/Behavioral:  Negative for dysphoric mood. The patient is nervous/anxious.        Objective:        12/31/2023    9:33 AM 11/28/2023    6:54 PM 10/11/2022  9:48 AM  Vitals with BMI  Height 5' 1  5' 1  Weight 156 lbs  150 lbs  BMI 29.49  28.36  Systolic 108 128 98  Diastolic 88 84 60  Pulse 99 86 76    No data found.   Physical Exam Vitals and nursing note reviewed.  Constitutional:      Appearance: Normal appearance.  HENT:     Head: Normocephalic and atraumatic.     Mouth/Throat:     Mouth: Mucous membranes are moist.     Pharynx: Oropharynx is clear.  Eyes:     Extraocular Movements: Extraocular movements intact.     Pupils: Pupils are equal, round, and reactive to light.  Cardiovascular:     Rate and Rhythm: Normal rate and regular rhythm.  Pulmonary:     Effort: Pulmonary effort is normal.     Breath sounds: Normal breath sounds.  Musculoskeletal:        General: Normal range of motion.  Skin:    General: Skin is warm and dry.  Neurological:     General: No focal deficit present.     Mental Status: She is alert and oriented to person, place, and time. Mental status is at baseline.  Psychiatric:        Mood and Affect: Mood normal.        Behavior: Behavior normal.     Health Maintenance Due  Topic Date Due   Cervical Cancer Screening (HPV/Pap Cotest)  Never done    There are no preventive care reminders to display for this patient.    Lab Results  Component Value Date   TSH 1.480 04/27/2022    Lab Results  Component Value Date   WBC 5.4 04/27/2022   HGB 12.8 04/27/2022   HCT 38.5 04/27/2022   MCV 91 04/27/2022   PLT 225 04/27/2022   Lab Results  Component Value Date   NA 138 04/27/2022   K 4.2 04/27/2022   CO2 22 04/27/2022   GLUCOSE 85 04/27/2022   BUN 9 04/27/2022   CREATININE 0.69 04/27/2022   BILITOT 0.3 04/27/2022   ALKPHOS 53 04/27/2022   AST 26 04/27/2022   ALT 36 (H) 04/27/2022   PROT 6.9 04/27/2022   ALBUMIN 4.2 04/27/2022   CALCIUM 9.0 04/27/2022   EGFR 120 04/27/2022   GFR 112.67 06/28/2017   Lab Results  Component Value Date   CHOL 160 10/11/2022   Lab Results  Component Value Date   HDL 46 10/11/2022   Lab Results  Component Value Date   LDLCALC 92 10/11/2022   Lab Results  Component Value Date   TRIG 121 10/11/2022   Lab Results  Component Value Date   CHOLHDL 3.5 10/11/2022   No results found for: HGBA1C      Results for orders placed or performed during the hospital encounter of 11/28/23  POCT rapid strep A   Collection Time: 11/28/23  7:04 PM  Result Value Ref Range   Rapid Strep A Screen Negative Negative  POC SARS Coronavirus 2 Ag   Collection Time: 11/28/23  7:11 PM  Result Value Ref Range   SARS Coronavirus 2 Ag Negative Negative     Assessment & Plan:   Assessment & Plan Pressure in head Recurrent episodes of fatigue, head pressure, and low-grade fever occurring monthly for the past three months, with associated nausea and nasal congestion. Symptoms follow a cyclical pattern, potentially related to menstrual cycles. Differential diagnosis includes hormonal fluctuations, anemia, vitamin deficiencies,  or other systemic issues. Previous tests for COVID and strep were negative. Symptoms resolve within a few days each month. - Order CBC with differential - Order metabolic profile - Order thyroid  function tests - Order vitamin B12 and folic acid  levels - Order vitamin D  level - Order ESR - Advise keeping a  symptom diary to track patterns and potential triggers - Advise checking blood pressure during symptomatic episodes  OVERALL, THE ETIOLOGY OF HER VAGUE SYMPTOMS IS UNCLEAR. DOES NOT APPEAR TO BE PMDD OR PMS. IF ALL THE BLOOD WORK COMES BACK NORMAL AND IF SHE CONTINUES TO HAVE THIS PATTERN, COULD CONSIDER EITHER A COMBINED BIRTH CONTROL PILL TO REGULARIZE HER HORMONES OR CONSIDER A LOW DOSE SSRI FOR POSSIBLE ANXIETY OR PMDD.  Orders:   CBC with Differential/Platelet   Comprehensive metabolic panel with GFR   Sedimentation rate  Other fatigue As above.   Orders:   B12 and Folate Panel   CBC with Differential/Platelet   Comprehensive metabolic panel with GFR   T4, free   TSH   Sedimentation rate  Vitamin D  deficiency Known vitamin D  deficiency, a potential contributor to fatigue but unlikely the sole cause of current symptoms. - Order vitamin D  level  Orders:   VITAMIN D  25 Hydroxy (Vit-D Deficiency, Fractures)  Migraine without aura, not intractable, without status migrainosus Current head pressure does not resemble typical migraine symptoms. Described as pressure rather than a headache, with no associated migraine aura or typical migraine characteristics. Takes OTC excedrin migraine with good relief of her headaches.       Body mass index is 29.48 kg/m.SABRA      Allergic rhinitis Chronic nasal congestion likely due to allergic rhinitis, coinciding with seasonal allergies. She takes Zyrtec as needed.          No orders of the defined types were placed in this encounter.   Orders Placed This Encounter  Procedures   B12 and Folate Panel   CBC with Differential/Platelet   Comprehensive metabolic panel with GFR   T4, free   TSH   VITAMIN D  25 Hydroxy (Vit-D Deficiency, Fractures)   Sedimentation rate     Follow-up: No follow-ups on file.  An After Visit Summary was printed and given to the patient.  Diarra Kos, MD [facility] [phone]

## 2023-12-31 NOTE — Assessment & Plan Note (Addendum)
 As above.   Orders:   B12 and Folate Panel   CBC with Differential/Platelet   Comprehensive metabolic panel with GFR   T4, free   TSH   Sedimentation rate

## 2023-12-31 NOTE — Assessment & Plan Note (Addendum)
 Known vitamin D  deficiency, a potential contributor to fatigue but unlikely the sole cause of current symptoms. - Order vitamin D  level  Orders:   VITAMIN D  25 Hydroxy (Vit-D Deficiency, Fractures)

## 2023-12-31 NOTE — Assessment & Plan Note (Addendum)
 Current head pressure does not resemble typical migraine symptoms. Described as pressure rather than a headache, with no associated migraine aura or typical migraine characteristics. Takes OTC excedrin migraine with good relief of her headaches.

## 2024-01-01 LAB — CBC WITH DIFFERENTIAL/PLATELET
Basophils Absolute: 0 x10E3/uL (ref 0.0–0.2)
Basos: 0 %
EOS (ABSOLUTE): 0.1 x10E3/uL (ref 0.0–0.4)
Eos: 1 %
Hematocrit: 41.8 % (ref 34.0–46.6)
Hemoglobin: 13.8 g/dL (ref 11.1–15.9)
Immature Grans (Abs): 0 x10E3/uL (ref 0.0–0.1)
Immature Granulocytes: 0 %
Lymphocytes Absolute: 1.6 x10E3/uL (ref 0.7–3.1)
Lymphs: 15 %
MCH: 31.3 pg (ref 26.6–33.0)
MCHC: 33 g/dL (ref 31.5–35.7)
MCV: 95 fL (ref 79–97)
Monocytes Absolute: 0.8 x10E3/uL (ref 0.1–0.9)
Monocytes: 7 %
Neutrophils Absolute: 8 x10E3/uL — ABNORMAL HIGH (ref 1.4–7.0)
Neutrophils: 77 %
Platelets: 281 x10E3/uL (ref 150–450)
RBC: 4.41 x10E6/uL (ref 3.77–5.28)
RDW: 12.8 % (ref 11.7–15.4)
WBC: 10.5 x10E3/uL (ref 3.4–10.8)

## 2024-01-01 LAB — VITAMIN D 25 HYDROXY (VIT D DEFICIENCY, FRACTURES): Vit D, 25-Hydroxy: 18.5 ng/mL — ABNORMAL LOW (ref 30.0–100.0)

## 2024-01-01 LAB — COMPREHENSIVE METABOLIC PANEL WITH GFR
ALT: 21 IU/L (ref 0–32)
AST: 17 IU/L (ref 0–40)
Albumin: 4.1 g/dL (ref 4.0–5.0)
Alkaline Phosphatase: 72 IU/L (ref 41–116)
BUN/Creatinine Ratio: 10 (ref 9–23)
BUN: 7 mg/dL (ref 6–20)
Bilirubin Total: 0.3 mg/dL (ref 0.0–1.2)
CO2: 18 mmol/L — ABNORMAL LOW (ref 20–29)
Calcium: 9.4 mg/dL (ref 8.7–10.2)
Chloride: 104 mmol/L (ref 96–106)
Creatinine, Ser: 0.71 mg/dL (ref 0.57–1.00)
Globulin, Total: 2.8 g/dL (ref 1.5–4.5)
Glucose: 84 mg/dL (ref 70–99)
Potassium: 4.2 mmol/L (ref 3.5–5.2)
Sodium: 136 mmol/L (ref 134–144)
Total Protein: 6.9 g/dL (ref 6.0–8.5)
eGFR: 117 mL/min/1.73 (ref >59)

## 2024-01-01 LAB — B12 AND FOLATE PANEL
Folate: 4.8 ng/mL (ref >3.0)
Vitamin B-12: 375 pg/mL (ref 232–1245)

## 2024-01-01 LAB — T4, FREE: Free T4: 1.15 ng/dL (ref 0.82–1.77)

## 2024-01-01 LAB — TSH: TSH: 2.23 u[IU]/mL (ref 0.450–4.500)

## 2024-01-01 LAB — SEDIMENTATION RATE: Sed Rate: 51 mm/h — ABNORMAL HIGH (ref 0–32)

## 2024-01-04 ENCOUNTER — Ambulatory Visit: Payer: Self-pay

## 2024-01-04 DIAGNOSIS — R700 Elevated erythrocyte sedimentation rate: Secondary | ICD-10-CM

## 2024-01-04 DIAGNOSIS — R519 Headache, unspecified: Secondary | ICD-10-CM

## 2024-01-04 DIAGNOSIS — R5383 Other fatigue: Secondary | ICD-10-CM

## 2024-01-07 ENCOUNTER — Other Ambulatory Visit: Payer: Self-pay | Admitting: Family Medicine

## 2024-01-07 ENCOUNTER — Telehealth: Payer: Self-pay

## 2024-01-07 MED ORDER — VITAMIN D (ERGOCALCIFEROL) 50000 UNITS PO CAPS: 1.0000 | ORAL_CAPSULE | ORAL | 0 refills | Status: AC

## 2024-01-07 NOTE — Telephone Encounter (Signed)
 Copied from CRM [id number]. Topic: Clinical - Medication Question >> Jan 07, 2024 11:20 AM Natalie Marks I wrote: Reason for CRM: Patient would like to know if vitamin d  prescription could be sent in for her or if the prescription she had before can be renewed. Please send patient a message with update

## 2024-01-24 ENCOUNTER — Other Ambulatory Visit

## 2024-01-24 DIAGNOSIS — R519 Headache, unspecified: Secondary | ICD-10-CM

## 2024-01-24 DIAGNOSIS — R700 Elevated erythrocyte sedimentation rate: Secondary | ICD-10-CM

## 2024-01-24 DIAGNOSIS — R5383 Other fatigue: Secondary | ICD-10-CM

## 2024-01-25 ENCOUNTER — Ambulatory Visit: Payer: Self-pay

## 2024-01-25 LAB — C-REACTIVE PROTEIN: CRP: 1 mg/L (ref 0–10)

## 2024-01-25 LAB — SEDIMENTATION RATE: Sed Rate: 20 mm/h (ref 0–32)

## 2024-03-06 ENCOUNTER — Ambulatory Visit (HOSPITAL_BASED_OUTPATIENT_CLINIC_OR_DEPARTMENT_OTHER)
Admission: RE | Admit: 2024-03-06 | Discharge: 2024-03-06 | Disposition: A | Discharge status: Home / Self Care | Source: Ambulatory Visit | Attending: Family Medicine | Admitting: Family Medicine

## 2024-03-06 ENCOUNTER — Encounter (HOSPITAL_BASED_OUTPATIENT_CLINIC_OR_DEPARTMENT_OTHER): Payer: Self-pay

## 2024-03-06 ENCOUNTER — Other Ambulatory Visit (HOSPITAL_BASED_OUTPATIENT_CLINIC_OR_DEPARTMENT_OTHER): Payer: Self-pay

## 2024-03-06 VITALS — BP 113/78 | HR 76 | Temp 97.9°F | Resp 18

## 2024-03-06 DIAGNOSIS — J3489 Other specified disorders of nose and nasal sinuses: Secondary | ICD-10-CM | POA: Diagnosis not present

## 2024-03-06 DIAGNOSIS — J0140 Acute pansinusitis, unspecified: Secondary | ICD-10-CM | POA: Diagnosis not present

## 2024-03-06 MED ORDER — FLUTICASONE PROPIONATE 50 MCG/ACT NA SUSP
1.0000 | Freq: Two times a day (BID) | NASAL | 0 refills | Status: AC | PRN
  Filled 2024-03-06: qty 16, 30d supply, fill #0

## 2024-03-06 MED ORDER — SINUS RINSE BOTTLE KIT NA PACK: 1.0000 | PACK | Freq: Two times a day (BID) | NASAL | 0 refills | Status: AC | PRN

## 2024-03-06 MED ORDER — DOXYCYCLINE HYCLATE 100 MG PO CAPS
100.0000 mg | ORAL_CAPSULE | Freq: Two times a day (BID) | ORAL | 0 refills | Status: AC
  Filled 2024-03-06: qty 20, 10d supply, fill #0

## 2024-03-06 NOTE — ED Provider Notes (Signed)
 [facility]    CSN: [id number] Arrival date & time: 03/06/24  1644      History   Chief Complaint Chief Complaint  Patient presents with   Cough    Entered by patient    HPI Natalie Marks is a 33 y.o. female.   33 year old female who reports runny nose, cough, congestion that she thought was a viral infection.  This started on approximately 1121 or earlier.  In the last 3 days its gotten much worse with facial pain, facial pressure, ear pain/pressure and greenish dark nasal discharge morning noon and night.   Cough Associated symptoms: ear pain and rhinorrhea   Associated symptoms: no chest pain, no chills, no fever, no rash, no shortness of breath and no sore throat     Past Medical History:  Diagnosis Date   Anemia    Generalized anxiety disorder 08/19/2019   Headache(784.0)    Migraine without aura, not intractable, without status migrainosus 08/19/2019   Other urticaria 08/19/2019   Postpartum care following vaginal delivery (4/6) 07/08/2015    Patient Active Problem List   Diagnosis Date Noted   Pressure in head 12/31/2023   Other fatigue 05/26/2022   Vitamin D  deficiency 05/26/2022   Migraine without aura, not intractable, without status migrainosus 08/19/2019   Generalized anxiety disorder 08/19/2019   Other urticaria 08/19/2019   Routine general medical examination at a health care facility 12/19/2018    Past Surgical History:  Procedure Laterality Date   NO PAST SURGERIES      OB History     Gravida  3   Para  3   Term  3   Preterm  0   AB  0   Living  3      SAB  0   IAB  0   Ectopic  0   Multiple  0   Live Births  3            Home Medications    Prior to Admission medications   Medication Sig Start Date End Date Taking? Authorizing Provider  doxycycline  (VIBRAMYCIN ) 100 MG capsule Take 1 capsule (100 mg total) by mouth 2 (two) times daily for 10 days. 03/06/24 03/16/24 Yes Ival Domino, FNP  fluticasone   (FLONASE ) 50 MCG/ACT nasal spray Place 1 spray into both nostrils 2 (two) times daily as needed for rhinitis. 03/06/24 04/05/24 Yes Ival Domino, FNP  Hypertonic Nasal Wash (SINUS RINSE BOTTLE KIT) PACK Place 1 each into the nose 2 (two) times daily as needed (nasal congestion/pressure). 03/06/24  Yes Ival Domino, FNP  Vitamin D , Ergocalciferol , 50000 units CAPS Take 1 capsule by mouth once a week. 01/07/24  Yes Cox, Kirsten, MD  cetirizine (ZYRTEC) 10 MG chewable tablet Chew 10 mg by mouth daily.    [provider]  ondansetron  (ZOFRAN -ODT) 4 MG disintegrating tablet Take 1 tablet (4 mg total) by mouth every 8 (eight) hours as needed for nausea or vomiting. 11/28/23   Ival Domino, FNP    Family History Family History  Problem Relation Age of Onset   Diabetes Maternal Grandmother    Alcohol abuse Neg Hx    Arthritis Neg Hx    Asthma Neg Hx    Birth defects Neg Hx    Cancer Neg Hx    COPD Neg Hx    Depression Neg Hx    Drug abuse Neg Hx    Early death Neg Hx    Hearing loss Neg Hx    Heart  disease Neg Hx    Hyperlipidemia Neg Hx    Hypertension Neg Hx    Kidney disease Neg Hx    Learning disabilities Neg Hx    Mental illness Neg Hx    Mental retardation Neg Hx    Miscarriages / Stillbirths Neg Hx    Stroke Neg Hx    Vision loss Neg Hx     Social History Social History   Tobacco Use   Smoking status: Never   Smokeless tobacco: Never  Vaping Use   Vaping status: Never Used  Substance Use Topics   Alcohol use: No   Drug use: No     Allergies   Macrobid [nitrofurantoin macrocrystal]   Review of Systems Review of Systems  Constitutional:  Negative for chills and fever.  HENT:  Positive for congestion, ear pain, postnasal drip, rhinorrhea, sinus pressure and sinus pain. Negative for sore throat.   Eyes:  Negative for pain and visual disturbance.  Respiratory:  Positive for cough. Negative for shortness of breath.   Cardiovascular:  Negative for chest pain and  palpitations.  Gastrointestinal:  Negative for abdominal pain, constipation, diarrhea, nausea and vomiting.  Genitourinary:  Negative for dysuria and hematuria.  Musculoskeletal:  Negative for arthralgias and back pain.  Skin:  Negative for color change and rash.  Neurological:  Negative for seizures and syncope.  All other systems reviewed and are negative.    Physical Exam Triage Vital Signs ED Triage Vitals  Encounter Vitals Group     BP 03/06/24 1714 113/78     Girls Systolic BP Percentile --      Girls Diastolic BP Percentile --      Boys Systolic BP Percentile --      Boys Diastolic BP Percentile --      Pulse Rate 03/06/24 1714 76     Resp 03/06/24 1714 18     Temp 03/06/24 1714 97.9 F (36.6 C)     Temp Source 03/06/24 1714 Oral     SpO2 03/06/24 1714 96 %     Weight --      Height --      Head Circumference --      Peak Flow --      Pain Score 03/06/24 1713 0     Pain Loc --      Pain Education --      Exclude from Growth Chart --    No data found.  Updated Vital Signs BP 113/78 (BP Location: Right Arm)   Pulse 76   Temp 97.9 F (36.6 C) (Oral)   Resp 18   LMP 02/27/2024   SpO2 96%   Visual Acuity Right Eye Distance:   Left Eye Distance:   Bilateral Distance:    Right Eye Near:   Left Eye Near:    Bilateral Near:     Physical Exam Vitals and nursing note reviewed.  Constitutional:      General: She is not in acute distress.    Appearance: She is well-developed. She is not ill-appearing or toxic-appearing.  HENT:     Head: Normocephalic and atraumatic.     Right Ear: Hearing, tympanic membrane, ear canal and external ear normal.     Left Ear: Hearing, tympanic membrane, ear canal and external ear normal.     Nose: Mucosal edema, congestion and rhinorrhea present. Rhinorrhea is purulent.     Right Sinus: Maxillary sinus tenderness and frontal sinus tenderness present.     Left Sinus: Maxillary sinus  tenderness and frontal sinus tenderness  present.     Mouth/Throat:     Lips: Pink.     Mouth: Mucous membranes are moist.     Pharynx: Uvula midline. No oropharyngeal exudate or posterior oropharyngeal erythema.     Tonsils: No tonsillar exudate.  Eyes:     Conjunctiva/sclera: Conjunctivae normal.     Pupils: Pupils are equal, round, and reactive to light.  Cardiovascular:     Rate and Rhythm: Normal rate and regular rhythm.     Heart sounds: S1 normal and S2 normal. No murmur heard. Pulmonary:     Effort: Pulmonary effort is normal. No respiratory distress.     Breath sounds: Normal breath sounds. No decreased breath sounds, wheezing, rhonchi or rales.  Abdominal:     General: Bowel sounds are normal.     Palpations: Abdomen is soft.     Tenderness: There is no abdominal tenderness.  Musculoskeletal:        General: No swelling.     Cervical back: Neck supple.  Lymphadenopathy:     Head:     Right side of head: Tonsillar adenopathy present. No submental, submandibular, preauricular or posterior auricular adenopathy.     Left side of head: Tonsillar adenopathy present. No submental, submandibular, preauricular or posterior auricular adenopathy.     Cervical: Cervical adenopathy present.     Right cervical: Superficial cervical adenopathy present.     Left cervical: Superficial cervical adenopathy present.  Skin:    General: Skin is warm and dry.     Capillary Refill: Capillary refill takes less than 2 seconds.     Findings: No rash.  Neurological:     Mental Status: She is alert and oriented to person, place, and time.  Psychiatric:        Mood and Affect: Mood normal.      UC Treatments / Results  Labs (all labs ordered are listed, but only abnormal results are displayed) Labs Reviewed - No data to display  EKG   Radiology No results found.  Procedures Procedures (including critical care time)  Medications Ordered in UC Medications - No data to display  Initial Impression / Assessment and Plan /  UC Course  I have reviewed the triage vital signs and the nursing notes.  Pertinent labs & imaging results that were available during my care of the patient were reviewed by me and considered in my medical decision making (see chart for details).  Plan of Care (see discharge instructions for additional patient precautions and education): Sinusitis with sinus pressure: Doxycycline  100 mg twice daily for 10 days.  Encouraged sinus rinses twice daily for as long as you have sinus pressure or pain or discolored nasal discharge.  Fluticasone  nasal spray, 1 spray into each nostril twice daily for nasal congestion and sinus pressure.  Do not sniffle nor snort after use. Follow-up if symptoms do not improve, worsen or new symptoms occur.  Declined a work excuse.  I reviewed the plan of care with the patient and/or the patient's guardian.  The patient and/or guardian had time to ask questions and acknowledged that the questions were answered.  Final Clinical Impressions(s) / UC Diagnoses   Final diagnoses:  Acute non-recurrent pansinusitis  Sinus pain     Discharge Instructions      Sinusitis with sinus pressure: Doxycycline  100 mg twice daily for 10 days.  Encouraged sinus rinses twice daily for as long as you have sinus pressure or pain or discolored nasal discharge.  Fluticasone  nasal spray, 1 spray into each nostril twice daily for nasal congestion and sinus pressure.  Do not sniffle nor snort after use.  Follow-up if symptoms do not improve, worsen or new symptoms occur.  Declined a work excuse.     ED Prescriptions     Medication Sig Dispense Auth. Provider   doxycycline (VIBRAMYCIN) 100 MG capsule Take 1 capsule (100 mg total) by mouth 2 (two) times daily for 10 days. 20 capsule Ival Domino, FNP   Hypertonic Nasal Wash (SINUS RINSE BOTTLE KIT) PACK Place 1 each into the nose 2 (two) times daily as needed (nasal congestion/pressure). 1 each Ival Domino, FNP   fluticasone  (FLONASE )  50 MCG/ACT nasal spray Place 1 spray into both nostrils 2 (two) times daily as needed for rhinitis. 17 mL Ival Domino, FNP      PDMP not reviewed this encounter.   Ival Domino, FNP 03/06/24 1734

## 2024-03-06 NOTE — Discharge Instructions (Addendum)
 Sinusitis with sinus pressure: Doxycycline 100 mg twice daily for 10 days.  Encouraged sinus rinses twice daily for as long as you have sinus pressure or pain or discolored nasal discharge.  Fluticasone  nasal spray, 1 spray into each nostril twice daily for nasal congestion and sinus pressure.  Do not sniffle nor snort after use.  Follow-up if symptoms do not improve, worsen or new symptoms occur.  Declined a work excuse.

## 2024-03-06 NOTE — ED Triage Notes (Signed)
 Pt c/o coughing, she had a viral/ cold last week, she thought she was getting better but then she noticed green mucous, she has bilateral ear fullness, and she has nasal congestion.
